# Patient Record
Sex: Female | Born: 1960 | Race: White | Hispanic: No | Marital: Single | Smoking: Current every day smoker
Health system: Southern US, Community
[De-identification: ages and names within clinical notes are randomized; demographics above are authoritative.]

## PROBLEM LIST (undated history)

## (undated) DIAGNOSIS — M1990 Unspecified osteoarthritis, unspecified site: Secondary | ICD-10-CM

## (undated) DIAGNOSIS — F419 Anxiety disorder, unspecified: Secondary | ICD-10-CM

## (undated) DIAGNOSIS — G709 Myoneural disorder, unspecified: Secondary | ICD-10-CM

## (undated) HISTORY — PX: TONSILLECTOMY: SUR1361

## (undated) HISTORY — PX: OTHER SURGICAL HISTORY: SHX169

## (undated) HISTORY — PX: JOINT REPLACEMENT: SHX530

## (undated) HISTORY — PX: BACK SURGERY: SHX140

## (undated) HISTORY — PX: PARTIAL HIP ARTHROPLASTY: SHX733

---

## 2011-05-21 DIAGNOSIS — F330 Major depressive disorder, recurrent, mild: Secondary | ICD-10-CM | POA: Diagnosis not present

## 2011-08-21 DIAGNOSIS — F330 Major depressive disorder, recurrent, mild: Secondary | ICD-10-CM | POA: Diagnosis not present

## 2011-11-20 DIAGNOSIS — F330 Major depressive disorder, recurrent, mild: Secondary | ICD-10-CM | POA: Diagnosis not present

## 2012-01-04 ENCOUNTER — Emergency Department (INDEPENDENT_AMBULATORY_CARE_PROVIDER_SITE_OTHER)
Admission: EM | Admit: 2012-01-04 | Discharge: 2012-01-04 | Disposition: A | Discharge status: Home / Self Care | Payer: Medicare Other | Source: Home / Self Care

## 2012-01-04 ENCOUNTER — Ambulatory Visit (INDEPENDENT_AMBULATORY_CARE_PROVIDER_SITE_OTHER): Payer: Medicare Other | Admitting: Sports Medicine

## 2012-01-04 ENCOUNTER — Encounter: Payer: Self-pay | Admitting: *Deleted

## 2012-01-04 DIAGNOSIS — S7010XA Contusion of unspecified thigh, initial encounter: Secondary | ICD-10-CM | POA: Diagnosis not present

## 2012-01-04 DIAGNOSIS — R29898 Other symptoms and signs involving the musculoskeletal system: Secondary | ICD-10-CM

## 2012-01-04 DIAGNOSIS — T148XXA Other injury of unspecified body region, initial encounter: Secondary | ICD-10-CM | POA: Diagnosis not present

## 2012-01-04 DIAGNOSIS — R2240 Localized swelling, mass and lump, unspecified lower limb: Secondary | ICD-10-CM

## 2012-01-04 DIAGNOSIS — M79609 Pain in unspecified limb: Secondary | ICD-10-CM

## 2012-01-04 HISTORY — DX: Anxiety disorder, unspecified: F41.9

## 2012-01-04 HISTORY — DX: Unspecified osteoarthritis, unspecified site: M19.90

## 2012-01-04 LAB — POCT CBC W AUTO DIFF (K'VILLE URGENT CARE)

## 2012-01-04 NOTE — ED Provider Notes (Signed)
History     CSN: [id number]  Arrival date & time 01/04/12  1256   First MD Initiated Contact with Patient 01/04/12 1306      Chief Complaint  Patient presents with  . Mass    rigth hip   HPI R hip mass x 1 month.  Pt states that she struck her hip on bar while moving some furniture.  Initially had mild brusing that subsided within a week. Pt states that she has had persistent R hip soft tissue swelling since this point.  Area has been fairly stable, however, area has become mildly pain over the last week.  Pt states that she has had some burning around soft tissue swelling.  No other systemic sxs including fever, chills, diffuse bruising, night sweats, weight loss.  Pt is noted to have had multiple surgeries in the past including multiple laminectomies and L hip replacement.  No recent surgeries.    Past Medical History  Diagnosis Date  . Anxiety   . Osteoarthritis     Past Surgical History  Procedure Date  . Partial hip arthroplasty     left  . Back surgery     7 Lami's, 1 fusion    Family History  Problem Relation Age of Onset  . Cancer Mother     breast  . Cancer Father     lung    History  Substance Use Topics  . Smoking status: Current Every Day Smoker -- 1.0 packs/day for 30 years    Types: Cigarettes  . Smokeless tobacco: Not on file  . Alcohol Use: No    OB History    Grav Para Term Preterm Abortions TAB SAB Ect Mult Living                  Review of Systems  All other systems reviewed and are negative.    Allergies  Augmentin  Home Medications   Current Outpatient Rx  Name  Route  Sig  Dispense  Refill  . CLONAZEPAM 1 MG PO TABS   Oral   Take 1 mg by mouth 2 (two) times daily as needed.           BP 133/90  Pulse 93  Temp 99.8 F (37.7 C) (Oral)  Resp 14  Ht 5\' 8"  (1.727 m)  Wt 199 lb (90.266 kg)  BMI 30.26 kg/m2  SpO2 97%  Physical Exam  Constitutional: She appears well-developed and well-nourished.  HENT:  Head:  Normocephalic and atraumatic.  Eyes: Conjunctivae normal are normal. Pupils are equal, round, and reactive to light.  Neck: Normal range of motion. Neck supple.  Cardiovascular: Normal rate and regular rhythm.   Pulmonary/Chest: Effort normal and breath sounds normal.  Abdominal: Soft.  Musculoskeletal:       R hip full ROM  Ambulation WNL   Neurological: She is alert.  Skin:       + soft tissue mass on R hip nonerythematous Faint peripheral bruising.      Soft tissue mass measuring approx 10x8 cm.  ED Course  Procedures (including critical care time)   Labs Reviewed  POCT CBC W AUTO DIFF ([facility])   No results found.   1. Hip region mass       MDM  Of unclear etiology.  Soft tissue cyst vs. Hematoma.  Hgb and plts WNL.  Will formally consult sports medicine as ultrasound and possible drainage may be beneficial.  Treatment plan per sports medicine.  The patient and/or caregiver has been counseled thoroughly with regard to treatment plan and/or medications prescribed including dosage, schedule, interactions, rationale for use, and possible side effects and they verbalize understanding. Diagnoses and expected course of recovery discussed and will return if not improved as expected or if the condition worsens. Patient and/or caregiver verbalized understanding.               Doree Albee, MD 01/04/12 1421

## 2012-01-04 NOTE — ED Notes (Signed)
Patient bumped her right upper lateral thigh on a bar 1 month ago. About 1 week later, 3 weeks ago, she developed a lump at the site where the bruise healed. She reports site is painful described as burning. Denies any numbness or tingling, site is not discolored or warm to touch.

## 2012-01-04 NOTE — Progress Notes (Signed)
SPORTS MEDICINE CONSULTATION REPORT  Subjective:    I'm seeing this patient as a consultation for:  Dr. Alvester Morin  CC: Right thigh swelling  HPI: Amy Thornton is a very pleasant 51 year old female who comes in after bumping her right thigh approximately one month ago. Unfortunately, she developed some bruising, but also a mass approximately 10 cm distal to the greater trochanter on the lateral thigh.  Unfortunately as his remained painful. She denies any constitutional symptoms. The pain is localized, does not radiate, it is severe.  Past medical history, Surgical history, Family history, Social history, Allergies, and medications have been entered into the medical record, reviewed, and no changes needed.   Review of Systems: No headache, visual changes, nausea, vomiting, diarrhea, constipation, dizziness, abdominal pain, skin rash, fevers, chills, night sweats, weight loss, swollen lymph nodes, body aches, joint swelling, muscle aches, chest pain, or shortness of breath.   Objective:   Vitals:  Afebrile, vital signs stable. General: Well Developed, well nourished, and in no acute distress.  Neuro/Psych: Alert and oriented x3, extra-ocular muscles intact, able to move all 4 extremities.  Skin: Warm and dry, no rashes noted.  Respiratory: Not using accessory muscles, speaking in full sentences, trachea midline.  Cardiovascular: Pulses palpable, no extremity edema. Abdomen: Does not appear distended. Right thigh: There is a fluctuant, 10 x 10 cm mass that is mildly tender to palpation, with no erythema, induration.  Procedure: Real-time Ultrasound Guided aspiration/injection of hematoma Device: GE Logiq E  Ultrasound guided injection is preferred based studies that show increased duration, increased effect, greater accuracy, decreased procedural pain, increased response rate, and decreased cost with ultrasound guided versus blind injection.  Verbal informed consent obtained.  Time-out conducted.    Noted no overlying erythema, induration, or other signs of local infection.  Skin prepped in a sterile fashion.  Local anesthesia: Topical Ethyl chloride.  With sterile technique and under real time ultrasound guidance:  5 cc lidocaine is infiltrated under the skin. 18-gauge needle a 60 cc syringe advanced under real-time ultrasound guidance into hematoma, 110 cc of grossly bloody fluid was aspirated. The syringe was switched, 1 cc Kenalog 40, 4 cc lidocaine placed into the space where the hematoma was. Area was then strapped/compressed with six-inch Ace dressing. Completed without difficulty  Pain immediately resolved suggesting accurate placement of the medication.  Advised to call if fevers/chills, erythema, induration, drainage, or persistent bleeding.  Images permanently stored and available for review in the ultrasound unit.  Impression: Technically successful ultrasound guided aspiration/injection.  Impression and Recommendations:   This case required medical decision making of moderate complexity.

## 2012-01-04 NOTE — Assessment & Plan Note (Addendum)
Status post trauma, aspirated 110 cc of bloody fluid. I injected 1 cc triamcinolone, and compressed wound. Fluid sent for culture. Dr. Alvester Morin is going to check CBC, as well as Colace. She will come back to see me in one week. If there is reaccumulation of the collection, she would likely need JP drain placement.

## 2012-01-05 LAB — SYNOVIAL CELL COUNT + DIFF, W/ CRYSTALS
Crystals, Fluid: NONE SEEN
Eosinophils-Synovial: 0 % (ref 0–1)
Lymphocytes-Synovial Fld: 35 % — ABNORMAL HIGH (ref 0–20)
Monocyte/Macrophage: 16 % — ABNORMAL LOW (ref 50–90)
Neutrophil, Synovial: 49 % — ABNORMAL HIGH (ref 0–25)
WBC, Synovial: 630 cu mm — ABNORMAL HIGH (ref 0–200)

## 2012-01-06 ENCOUNTER — Telehealth: Payer: Self-pay | Admitting: Emergency Medicine

## 2012-01-06 MED ORDER — MELOXICAM 15 MG PO TABS: 15.0000 mg | ORAL_TABLET | Freq: Every day | ORAL | Status: DC

## 2012-01-08 LAB — BODY FLUID CULTURE
Gram Stain: NONE SEEN
Organism ID, Bacteria: NO GROWTH

## 2012-01-15 ENCOUNTER — Ambulatory Visit: Payer: Medicare Other | Admitting: Sports Medicine

## 2012-01-16 ENCOUNTER — Ambulatory Visit (INDEPENDENT_AMBULATORY_CARE_PROVIDER_SITE_OTHER): Payer: Medicare Other | Admitting: Sports Medicine

## 2012-01-16 ENCOUNTER — Encounter: Payer: Self-pay | Admitting: Sports Medicine

## 2012-01-16 VITALS — BP 122/89 | HR 99 | Wt 195.0 lb

## 2012-01-16 DIAGNOSIS — T148XXA Other injury of unspecified body region, initial encounter: Secondary | ICD-10-CM

## 2012-01-16 DIAGNOSIS — S7010XA Contusion of unspecified thigh, initial encounter: Secondary | ICD-10-CM | POA: Diagnosis not present

## 2012-01-16 NOTE — Assessment & Plan Note (Signed)
No recurrence, she does have some skin dimpling. There is a smaller palpable organized hematoma. She will massage this daily, and see me back in 3 months.

## 2012-01-16 NOTE — Progress Notes (Signed)
Subjective:    CC: Followup  HPI: I aspirated 110 cc of hematoma out of this lady's right thigh at her last visit approximately 2 weeks ago. We applied a compression dressing, and she returns today essentially symptom-free. She does have a small divot in the skin on her thigh. She has no pain, no further swelling.  Preventative measures: She does desire a primary care physician in our practice, I've asked her to look to our flyer and pick one of [place].  Past medical history, Surgical history, Family history, Social history, Allergies, and medications have been entered into the medical record, reviewed, and no changes needed.   Review of Systems: No fevers, chills, night sweats, weight loss, chest pain, or shortness of breath.   Objective:    General: Well Developed, well nourished, and in no acute distress.  Neuro: Alert and oriented x3, extra-ocular muscles intact.  HEENT: Normocephalic, atraumatic, pupils equal round reactive to light, neck supple, no masses, no lymphadenopathy, thyroid nonpalpable.  Skin: Warm and dry, no rashes. Cardiac: Regular rate and rhythm, no murmurs rubs or gallops.  Respiratory: Clear to auscultation bilaterally. Not using accessory muscles, speaking in full sentences. Right Thigh:  There is a several centimeter subcutaneous nodule consistent with hematoma over the right thigh. There is also a small divot in the skin.  Impression and Recommendations:

## 2012-03-18 DIAGNOSIS — F330 Major depressive disorder, recurrent, mild: Secondary | ICD-10-CM | POA: Diagnosis not present

## 2012-04-16 ENCOUNTER — Encounter: Payer: Self-pay | Admitting: Sports Medicine

## 2012-04-16 ENCOUNTER — Ambulatory Visit (INDEPENDENT_AMBULATORY_CARE_PROVIDER_SITE_OTHER): Payer: Medicare Other | Admitting: Sports Medicine

## 2012-04-16 ENCOUNTER — Ambulatory Visit (INDEPENDENT_AMBULATORY_CARE_PROVIDER_SITE_OTHER): Payer: Medicare Other

## 2012-04-16 VITALS — BP 125/73 | HR 79 | Wt 199.0 lb

## 2012-04-16 DIAGNOSIS — T148XXA Other injury of unspecified body region, initial encounter: Secondary | ICD-10-CM

## 2012-04-16 DIAGNOSIS — Z96649 Presence of unspecified artificial hip joint: Secondary | ICD-10-CM | POA: Diagnosis not present

## 2012-04-16 DIAGNOSIS — M169 Osteoarthritis of hip, unspecified: Secondary | ICD-10-CM | POA: Diagnosis not present

## 2012-04-16 DIAGNOSIS — M1611 Unilateral primary osteoarthritis, right hip: Secondary | ICD-10-CM

## 2012-04-16 DIAGNOSIS — M1610 Unilateral primary osteoarthritis, unspecified hip: Secondary | ICD-10-CM | POA: Diagnosis not present

## 2012-04-16 DIAGNOSIS — Z96641 Presence of right artificial hip joint: Secondary | ICD-10-CM | POA: Insufficient documentation

## 2012-04-16 NOTE — Patient Instructions (Signed)
Hip Rehabilitation Protocol:  1.  Side leg raises.  3x30 with no weight, then 3x15 with 2 lb ankle weight, then 3x15 with 5 lb ankle weight 2.  Standing hip rotation.  3x30 with no weight, then 3x15 with 2 lb ankle weight, then 3x15 with 5 lb ankle weight. 3.  Side step ups.  3x30 with no weight, then 3x15 with 5 lbs in backpack, then 3x15 with 10 lbs in backpack. 

## 2012-04-16 NOTE — Assessment & Plan Note (Signed)
Seemingly resolved after aspiration. Continue to massage. I do suspect a scar tissue will resolve.

## 2012-04-16 NOTE — Progress Notes (Signed)
  Subjective:    CC: Followup  HPI: Hematoma: Occurred months ago after mild trauma. I aspirated over 100 cc of blood from her thigh, and apply compression. Since then she's been massaging of the scar tissue. She is nearly 100% improved.  Right hip pain: Status post left total hip arthroplasty, now with worsening pain that she localizes deep in the left groin and left buttock, without radiation. Worse with weightbearing. She's never had injection therapy or physical therapy with pain on the side. Pain is moderate.  Past medical history, Surgical history, Family history not pertinant except as noted below, Social history, Allergies, and medications have been entered into the medical record, reviewed, and no changes needed.   Review of Systems: No headache, visual changes, nausea, vomiting, diarrhea, constipation, dizziness, abdominal pain, skin rash, fevers, chills, night sweats, weight loss, swollen lymph nodes, body aches, joint swelling, muscle aches, chest pain, shortness of breath, mood changes, visual or auditory hallucinations.   Objective:   General: Well Developed, well nourished, and in no acute distress.  Neuro/Psych: Alert and oriented x3, extra-ocular muscles intact, able to move all 4 extremities, sensation grossly intact. Skin: Warm and dry, no rashes noted. Previous area of hematoma appears smooth and resolved. There is a small amount of nodularity. Respiratory: Not using accessory muscles, speaking in full sentences, trachea midline.  Cardiovascular: Pulses palpable, no extremity edema. Abdomen: Does not appear distended. Right Hip: ROM IR: 20 Deg and painful, ER: 45 Deg, Flexion: 120 Deg, Extension: 100 Deg, Abduction: 45 Deg, Adduction: 45 Deg Strength IR: 5/5, ER: 5/5, Flexion: 5/5, Extension: 5/5, Abduction: 5/5, Adduction: 5/5 Pelvic alignment unremarkable to inspection and palpation. Standing hip rotation and gait without trendelenburg sign / unsteadiness. Greater  trochanter without tenderness to palpation. No tenderness over piriformis and greater trochanter. No pain with FABER or FADIR. No SI joint tenderness and normal minimal SI movement.  X-ray show moderate degenerative joint disease of the right hip as well as a well-placed left total hip arthroplasty prosthesis. Impression and Recommendations:   This case required medical decision making of moderate complexity.

## 2012-04-16 NOTE — Assessment & Plan Note (Signed)
X-rays, hip abductor rehabilitation, return to see me in a couple weeks if no better I can perform intra-articular injection.

## 2012-04-30 ENCOUNTER — Ambulatory Visit: Payer: Medicare Other | Admitting: Sports Medicine

## 2012-06-24 DIAGNOSIS — F330 Major depressive disorder, recurrent, mild: Secondary | ICD-10-CM | POA: Diagnosis not present

## 2012-08-01 ENCOUNTER — Ambulatory Visit (INDEPENDENT_AMBULATORY_CARE_PROVIDER_SITE_OTHER): Payer: Medicare Other | Admitting: Sports Medicine

## 2012-08-01 ENCOUNTER — Encounter: Payer: Self-pay | Admitting: Sports Medicine

## 2012-08-01 VITALS — BP 132/76 | HR 84 | Wt 197.0 lb

## 2012-08-01 DIAGNOSIS — M169 Osteoarthritis of hip, unspecified: Secondary | ICD-10-CM

## 2012-08-01 DIAGNOSIS — M1610 Unilateral primary osteoarthritis, unspecified hip: Secondary | ICD-10-CM

## 2012-08-01 DIAGNOSIS — M1611 Unilateral primary osteoarthritis, right hip: Secondary | ICD-10-CM

## 2012-08-01 NOTE — Assessment & Plan Note (Signed)
Femoroacetabular joint injection as above. Continue home rehabilitation. Return in 4 weeks to see how things are going.

## 2012-08-01 NOTE — Progress Notes (Signed)
  Subjective:    CC: Followup  HPI: Right hip osteoarthritis: I saw this pleasant 52 year old female months ago, she has been working aggressively on hip abductor exercises, and in using oral NSAIDs. Since then she is also got married. Unfortunately she continues to have pain she localizes in the right groin, no radiation, moderate, persistent. She has had a left total hip arthroplasty in the past.  Past medical history, Surgical history, Family history not pertinant except as noted below, Social history, Allergies, and medications have been entered into the medical record, reviewed, and no changes needed.   Review of Systems: No fevers, chills, night sweats, weight loss, chest pain, or shortness of breath.   Objective:    General: Well Developed, well nourished, and in no acute distress.  Neuro: Alert and oriented x3, extra-ocular muscles intact, sensation grossly intact.  HEENT: Normocephalic, atraumatic, pupils equal round reactive to light, neck supple, no masses, no lymphadenopathy, thyroid nonpalpable.  Skin: Warm and dry, no rashes. Cardiac: Regular rate and rhythm, no murmurs rubs or gallops, no lower extremity edema.  Respiratory: Clear to auscultation bilaterally. Not using accessory muscles, speaking in full sentences.  Procedure: Real-time Ultrasound Guided Injection of right femoral acetabular joint Device: GE Logiq E  Verbal informed consent obtained.  Time-out conducted.  Noted no overlying erythema, induration, or other signs of local infection.  Skin prepped in a sterile fashion.  Local anesthesia: Topical Ethyl chloride.  With sterile technique and under real time ultrasound guidance:  Spinal needle advanced to the femoral head/neck junction, 2 cc Kenalog 40, 4 cc lidocaine injected easily. Completed without difficulty  Pain immediately resolved suggesting accurate placement of the medication.  Advised to call if fevers/chills, erythema, induration, drainage, or  persistent bleeding.  Images permanently stored and available for review in the ultrasound unit.  Impression: Technically successful ultrasound guided injection.  Impression and Recommendations:

## 2012-08-08 ENCOUNTER — Ambulatory Visit (INDEPENDENT_AMBULATORY_CARE_PROVIDER_SITE_OTHER): Payer: Medicare Other | Admitting: Sports Medicine

## 2012-08-08 ENCOUNTER — Encounter: Payer: Self-pay | Admitting: Sports Medicine

## 2012-08-08 VITALS — BP 143/78 | HR 91 | Wt 197.0 lb

## 2012-08-08 DIAGNOSIS — M1611 Unilateral primary osteoarthritis, right hip: Secondary | ICD-10-CM

## 2012-08-08 DIAGNOSIS — M1610 Unilateral primary osteoarthritis, unspecified hip: Secondary | ICD-10-CM

## 2012-08-08 DIAGNOSIS — M169 Osteoarthritis of hip, unspecified: Secondary | ICD-10-CM

## 2012-08-08 MED ORDER — TRAMADOL HCL 50 MG PO TABS: 50.0000 mg | ORAL_TABLET | Freq: Three times a day (TID) | ORAL | Status: DC | PRN

## 2012-08-08 NOTE — Progress Notes (Signed)
  Subjective:    CC: Right hip pain  HPI: Amy Thornton is a very pleasant 52 year old female with known right-sided femoral acetabular degenerative joint disease. Recently injected her femoral acetabular joint under guidance, she had complete resolution of pain, unfortunately she recently had a fall, impacting her right hip. She was able to bear weight immediately, she had a significant amount of pain she localized over the anterior joint line. It is localized, doesn't radiate, moderate to severe. She describes her pain is differently than the osteoarthritic pain she had before the injection. Pain is reproducible with hip flexion actively.  Past medical history, Surgical history, Family history not pertinant except as noted below, Social history, Allergies, and medications have been entered into the medical record, reviewed, and no changes needed.   Review of Systems: No fevers, chills, night sweats, weight loss, chest pain, or shortness of breath.   Objective:    General: Well Developed, well nourished, and in no acute distress.  Neuro: Alert and oriented x3, extra-ocular muscles intact, sensation grossly intact.  HEENT: Normocephalic, atraumatic, pupils equal round reactive to light, neck supple, no masses, no lymphadenopathy, thyroid nonpalpable.  Skin: Warm and dry, no rashes. Cardiac: Regular rate and rhythm, no murmurs rubs or gallops, no lower extremity edema.  Respiratory: Clear to auscultation bilaterally. Not using accessory muscles, speaking in full sentences. Right Hip: ROM IR: 45 Deg, ER: 45 Deg, Flexion: 120 Deg, Extension: 100 Deg, Abduction: 45 Deg, Adduction: 45 Deg Strength IR: 5/5, ER: 5/5, Flexion: 5/5, Extension: 5/5, Abduction: 5/5, Adduction: 5/5 She has no pain with passive extreme internal rotation as well as flexion of the hip, I asked her to actively flex the hip against resistance this reproduces her pain. Pelvic alignment unremarkable to inspection and palpation. Standing  hip rotation and gait without trendelenburg sign / unsteadiness. Greater trochanter without tenderness to palpation. No tenderness over piriformis and greater trochanter. No pain with FABER or FADIR. No SI joint tenderness and normal minimal SI movement.  Impression and Recommendations:

## 2012-08-08 NOTE — Assessment & Plan Note (Signed)
Femoral acetabular pain continues to be gone. Her recent injury has caused the hip flexor strain, I do not think she done any permanent damage, and I do not think he needs another injection. Tramadol, hip flexor rehabilitation. I would like to see her back in approximately 2-3 weeks to ensure things continue to go well.

## 2012-08-26 ENCOUNTER — Encounter: Payer: Self-pay | Admitting: Sports Medicine

## 2012-08-26 ENCOUNTER — Ambulatory Visit (INDEPENDENT_AMBULATORY_CARE_PROVIDER_SITE_OTHER): Payer: Medicare Other | Admitting: Sports Medicine

## 2012-08-26 VITALS — BP 127/80 | HR 84 | Wt 196.0 lb

## 2012-08-26 DIAGNOSIS — M1610 Unilateral primary osteoarthritis, unspecified hip: Secondary | ICD-10-CM

## 2012-08-26 DIAGNOSIS — M169 Osteoarthritis of hip, unspecified: Secondary | ICD-10-CM

## 2012-08-26 DIAGNOSIS — M1611 Unilateral primary osteoarthritis, right hip: Secondary | ICD-10-CM

## 2012-08-26 NOTE — Progress Notes (Signed)
  Subjective:    CC: Followup  HPI: Right hip osteoarthritis:  Amy Thornton has had a single intra-articular injection in the distant past into her femoroacetabular joint.  She did extremely well, but unfortunately had a fall several weeks ago. I diagnosed her initially with a hip flexor strain, but unfortunately her pain has continued. She desires intra-articular interventional treatment. Pain is localized in the groin, no radiation, worse with ambulation.  Past medical history, Surgical history, Family history not pertinant except as noted below, Social history, Allergies, and medications have been entered into the medical record, reviewed, and no changes needed.   Review of Systems: No fevers, chills, night sweats, weight loss, chest pain, or shortness of breath.   Objective:    General: Well Developed, well nourished, and in no acute distress.  Neuro: Alert and oriented x3, extra-ocular muscles intact, sensation grossly intact.  HEENT: Normocephalic, atraumatic, pupils equal round reactive to light, neck supple, no masses, no lymphadenopathy, thyroid nonpalpable.  Skin: Warm and dry, no rashes. Cardiac: Regular rate and rhythm, no murmurs rubs or gallops, no lower extremity edema.  Respiratory: Clear to auscultation bilaterally. Not using accessory muscles, speaking in full sentences.  Procedure: Real-time Ultrasound Guided Injection of right femoroacetabular joint Device: GE Logiq E  Verbal informed consent obtained.  Time-out conducted.  Noted no overlying erythema, induration, or other signs of local infection.  Skin prepped in a sterile fashion.  Local anesthesia: Topical Ethyl chloride.  With sterile technique and under real time ultrasound guidance:  Spinal needle advanced to the femoral head/neck junction, 2 cc Kenalog 40, or cc lidocaine injected easily into the joint. Completed without difficulty  Pain immediately resolved suggesting accurate placement of the medication.  Advised to  call if fevers/chills, erythema, induration, drainage, or persistent bleeding.  Images permanently stored and available for review in the ultrasound unit.  Impression: Technically successful ultrasound guided injection.  Impression and Recommendations:

## 2012-08-26 NOTE — Assessment & Plan Note (Signed)
Persistent pain after a fall. Hip flexor rehabilitation did not resolve this, she did have excellent response to a right-sided femoral acetabular injection several months ago. Femoral acetabular injection repeated today. Return in 4 weeks to see how things are going.

## 2012-08-29 ENCOUNTER — Ambulatory Visit: Payer: Medicare Other | Admitting: Sports Medicine

## 2012-09-04 ENCOUNTER — Telehealth: Payer: Self-pay

## 2012-09-04 DIAGNOSIS — M1611 Unilateral primary osteoarthritis, right hip: Secondary | ICD-10-CM

## 2012-09-04 NOTE — Telephone Encounter (Signed)
Pt informed as directed. Thursa Emme,CMA \

## 2012-09-04 NOTE — Telephone Encounter (Signed)
Double tramadol to 2 tabs 3 times a day. If she is continuing to have pain we need to consider surgical referral for consideration of hip replacement.

## 2012-09-04 NOTE — Telephone Encounter (Signed)
Patient called stated that she is in pain wants to know if you can call her something in for pain until her next visit she states that the tramadol is not working.

## 2012-09-08 ENCOUNTER — Other Ambulatory Visit: Payer: Self-pay

## 2012-09-08 DIAGNOSIS — M1611 Unilateral primary osteoarthritis, right hip: Secondary | ICD-10-CM

## 2012-09-08 MED ORDER — TRAMADOL HCL 50 MG PO TABS: 100.0000 mg | ORAL_TABLET | Freq: Three times a day (TID) | ORAL | Status: DC | PRN

## 2012-09-23 ENCOUNTER — Ambulatory Visit: Payer: Medicare Other | Admitting: Sports Medicine

## 2012-10-02 DIAGNOSIS — F330 Major depressive disorder, recurrent, mild: Secondary | ICD-10-CM | POA: Diagnosis not present

## 2012-10-07 ENCOUNTER — Ambulatory Visit (INDEPENDENT_AMBULATORY_CARE_PROVIDER_SITE_OTHER): Payer: Medicare Other | Admitting: Sports Medicine

## 2012-10-07 ENCOUNTER — Encounter: Payer: Self-pay | Admitting: Sports Medicine

## 2012-10-07 VITALS — BP 138/88 | HR 94 | Wt 196.0 lb

## 2012-10-07 DIAGNOSIS — M1610 Unilateral primary osteoarthritis, unspecified hip: Secondary | ICD-10-CM

## 2012-10-07 DIAGNOSIS — M5416 Radiculopathy, lumbar region: Secondary | ICD-10-CM | POA: Insufficient documentation

## 2012-10-07 DIAGNOSIS — M169 Osteoarthritis of hip, unspecified: Secondary | ICD-10-CM

## 2012-10-07 DIAGNOSIS — IMO0002 Reserved for concepts with insufficient information to code with codable children: Secondary | ICD-10-CM | POA: Diagnosis not present

## 2012-10-07 DIAGNOSIS — M1611 Unilateral primary osteoarthritis, right hip: Secondary | ICD-10-CM

## 2012-10-07 MED ORDER — HYDROCODONE-ACETAMINOPHEN 10-325 MG PO TABS: 1.0000 | ORAL_TABLET | Freq: Three times a day (TID) | ORAL | Status: DC | PRN

## 2012-10-07 MED ORDER — PREDNISONE 50 MG PO TABS: ORAL_TABLET | ORAL | Status: DC

## 2012-10-07 MED ORDER — CYCLOBENZAPRINE HCL 10 MG PO TABS: ORAL_TABLET | ORAL | Status: DC

## 2012-10-07 MED ORDER — KETOROLAC TROMETHAMINE 30 MG/ML IJ SOLN
30.0000 mg | Freq: Once | INTRAMUSCULAR | Status: AC
  Administered 2012-10-07: 30 mg via INTRAMUSCULAR

## 2012-10-07 NOTE — Assessment & Plan Note (Addendum)
Status post lumbar fusion in the distant past, I am unsure as to what level. Toradol 30 mg intramuscular. She is having some right-sided radicular symptoms, likely L4 versus L5. Prednisone, Flexeril, Norco, x-rays, return to see me in one month to see how things are going. Home exercises.

## 2012-10-07 NOTE — Assessment & Plan Note (Signed)
Status post 2 intra-articular injections with guidance. Her most recent injection has not provided pain relief we would like. At this point I'm going to have her see Dr. Turner Daniels for consideration of right total hip arthroplasty, she is already status post left total hip arthroplasty.

## 2012-10-07 NOTE — Progress Notes (Signed)
  Subjective:    CC: Followup  HPI: Right hip pain: He is status post a left total hip arthroplasty, I have been injecting her right hip joint. The first injection provided a tremendous response, the most recent one provided only a day of improvement. Pain is localized in the right groin, worse with weightbearing, moderate, persistent.  Right lumbar radiculitis: She is status post lumbar fusion, she is unsure what level. She has pain that she localizes over the anterior right thigh, as well as the anterolateral right lower leg with numbness in the dorsum of the foot. Pain is worse with Valsalva, and worse with sitting.  Past medical history, Surgical history, Family history not pertinant except as noted below, Social history, Allergies, and medications have been entered into the medical record, reviewed, and no changes needed.   Review of Systems: No fevers, chills, night sweats, weight loss, chest pain, or shortness of breath.   Objective:    General: Well Developed, well nourished, and in no acute distress.  Neuro: Alert and oriented x3, extra-ocular muscles intact, sensation grossly intact.  HEENT: Normocephalic, atraumatic, pupils equal round reactive to light, neck supple, no masses, no lymphadenopathy, thyroid nonpalpable.  Skin: Warm and dry, no rashes. Cardiac: Regular rate and rhythm, no murmurs rubs or gallops, no lower extremity edema.  Respiratory: Clear to auscultation bilaterally. Not using accessory muscles, speaking in full sentences. Back Exam:  Inspection: Unremarkable  Motion: Flexion 45 deg, Extension 45 deg, Side Bending to 45 deg bilaterally,  Rotation to 45 deg bilaterally  SLR laying: Negative  XSLR laying: Negative  Palpable tenderness: None. FABER: negative. Sensory change: Gross sensation intact to all lumbar and sacral dermatomes.  Reflexes: 2+ at both patellar tendons, 2+ at achilles tendons, Babinski's downgoing.  Strength at foot  Plantar-flexion: 5/5  Dorsi-flexion: 5/5 Eversion: 5/5 Inversion: 5/5  Leg strength  Quad: 5/5 Hamstring: 5/5 Hip flexor: 5/5 Hip abductors: 5/5  Gait unremarkable. Exquisite pain with internal rotation of the right hip.  Previous x-rays have shown moderate osteoarthritis.  Impression and Recommendations:

## 2012-10-14 DIAGNOSIS — IMO0002 Reserved for concepts with insufficient information to code with codable children: Secondary | ICD-10-CM | POA: Diagnosis not present

## 2012-10-14 DIAGNOSIS — M169 Osteoarthritis of hip, unspecified: Secondary | ICD-10-CM | POA: Diagnosis not present

## 2012-10-23 ENCOUNTER — Other Ambulatory Visit: Payer: Self-pay | Admitting: Orthopedic Surgery

## 2012-10-24 ENCOUNTER — Encounter (HOSPITAL_COMMUNITY): Payer: Self-pay | Admitting: Pharmacy Technician

## 2012-10-29 ENCOUNTER — Encounter (HOSPITAL_COMMUNITY): Payer: Self-pay

## 2012-10-29 ENCOUNTER — Encounter (HOSPITAL_COMMUNITY)
Admission: RE | Admit: 2012-10-29 | Discharge: 2012-10-29 | Disposition: A | Discharge status: Home / Self Care | Payer: Medicare Other | Source: Ambulatory Visit | Attending: Orthopedic Surgery | Admitting: Orthopedic Surgery

## 2012-10-29 DIAGNOSIS — Z01818 Encounter for other preprocedural examination: Secondary | ICD-10-CM | POA: Diagnosis not present

## 2012-10-29 DIAGNOSIS — Z01812 Encounter for preprocedural laboratory examination: Secondary | ICD-10-CM | POA: Insufficient documentation

## 2012-10-29 DIAGNOSIS — Z01810 Encounter for preprocedural cardiovascular examination: Secondary | ICD-10-CM | POA: Diagnosis not present

## 2012-10-29 DIAGNOSIS — I10 Essential (primary) hypertension: Secondary | ICD-10-CM | POA: Diagnosis not present

## 2012-10-29 HISTORY — DX: Myoneural disorder, unspecified: G70.9

## 2012-10-29 LAB — BASIC METABOLIC PANEL
BUN: 10 mg/dL (ref 6–23)
CO2: 30 mEq/L (ref 19–32)
Calcium: 9.5 mg/dL (ref 8.4–10.5)
Chloride: 97 mEq/L (ref 96–112)
Creatinine, Ser: 0.46 mg/dL — ABNORMAL LOW (ref 0.50–1.10)
GFR calc Af Amer: >90 mL/min (ref >90)
GFR calc non Af Amer: >90 mL/min (ref >90)
Glucose, Bld: 88 mg/dL (ref 70–99)
Potassium: 4.4 mEq/L (ref 3.5–5.1)
Sodium: 137 mEq/L (ref 135–145)

## 2012-10-29 LAB — TYPE AND SCREEN
ABO/RH(D): O POS
Antibody Screen: NEGATIVE

## 2012-10-29 LAB — URINALYSIS, ROUTINE W REFLEX MICROSCOPIC
Bilirubin Urine: NEGATIVE
Glucose, UA: NEGATIVE mg/dL
Hgb urine dipstick: NEGATIVE
Ketones, ur: NEGATIVE mg/dL
Leukocytes, UA: NEGATIVE
Nitrite: NEGATIVE
Protein, ur: NEGATIVE mg/dL
Specific Gravity, Urine: 1.002 — ABNORMAL LOW (ref 1.005–1.030)
Urobilinogen, UA: 0.2 mg/dL (ref 0.0–1.0)
pH: 6 (ref 5.0–8.0)

## 2012-10-29 LAB — CBC WITH DIFFERENTIAL/PLATELET
Basophils Absolute: 0 10*3/uL (ref 0.0–0.1)
Basophils Relative: 0 % (ref 0–1)
Eosinophils Absolute: 0.1 10*3/uL (ref 0.0–0.7)
Eosinophils Relative: 1 % (ref 0–5)
HCT: 46.2 % — ABNORMAL HIGH (ref 36.0–46.0)
Hemoglobin: 16.3 g/dL — ABNORMAL HIGH (ref 12.0–15.0)
Lymphocytes Relative: 31 % (ref 12–46)
Lymphs Abs: 2.1 10*3/uL (ref 0.7–4.0)
MCH: 34.5 pg — ABNORMAL HIGH (ref 26.0–34.0)
MCHC: 35.3 g/dL (ref 30.0–36.0)
MCV: 97.7 fL (ref 78.0–100.0)
Monocytes Absolute: 0.6 10*3/uL (ref 0.1–1.0)
Monocytes Relative: 8 % (ref 3–12)
Neutro Abs: 4.1 10*3/uL (ref 1.7–7.7)
Neutrophils Relative %: 60 % (ref 43–77)
Platelets: 205 10*3/uL (ref 150–400)
RBC: 4.73 MIL/uL (ref 3.87–5.11)
RDW: 13 % (ref 11.5–15.5)
WBC: 6.9 10*3/uL (ref 4.0–10.5)

## 2012-10-29 LAB — PROTIME-INR
INR: 1 (ref 0.00–1.49)
Prothrombin Time: 13 seconds (ref 11.6–15.2)

## 2012-10-29 LAB — APTT: aPTT: 28 seconds (ref 24–37)

## 2012-10-29 LAB — ABO/RH: ABO/RH(D): O POS

## 2012-10-29 LAB — SURGICAL PCR SCREEN
MRSA, PCR: NEGATIVE
Staphylococcus aureus: NEGATIVE

## 2012-10-29 NOTE — Pre-Procedure Instructions (Signed)
Emmajean Ratledge  10/29/2012   Your procedure is scheduled on:  11/03/12  Report to [facility] [phone].  Call this number if you have problems the morning of surgery: [phone]   Remember:   Do not eat food or drink liquids after midnight.   Take these medicines the morning of surgery with A SIP OF WATER: pain med, clonazepam   Do not wear jewelry, make-up or nail polish.  Do not wear lotions, powders, or perfumes. You may wear deodorant.  Do not shave 48 hours prior to surgery. Men may shave face and neck.  Do not bring valuables to the hospital.  [facility] is not responsible                   for any belongings or valuables.  Contacts, dentures or bridgework may not be worn into surgery.  Leave suitcase in the car. After surgery it may be brought to your room.  For patients admitted to the hospital, checkout time is 11:00 AM the day of  discharge.   Patients discharged the day of surgery will not be allowed to drive  home.  Name and phone number of your driver:   Special Instructions: Incentive Spirometry - Practice and bring it with you on the day of surgery. Shower using CHG 2 nights before surgery and the night before surgery.  If you shower the day of surgery use CHG.  Use special wash - you have one bottle of CHG for all showers.  You should use approximately 1/3 of the bottle for each shower.   Please read over the following fact sheets that you were given: Pain Booklet, Coughing and Deep Breathing, Blood Transfusion Information, MRSA Information and Surgical Site Infection Prevention

## 2012-10-30 ENCOUNTER — Encounter: Payer: Self-pay | Admitting: *Deleted

## 2012-10-30 ENCOUNTER — Emergency Department (INDEPENDENT_AMBULATORY_CARE_PROVIDER_SITE_OTHER)
Admission: EM | Admit: 2012-10-30 | Discharge: 2012-10-30 | Disposition: A | Discharge status: Home / Self Care | Payer: Medicare Other | Source: Home / Self Care | Attending: Family Medicine | Admitting: Family Medicine

## 2012-10-30 DIAGNOSIS — L02419 Cutaneous abscess of limb, unspecified: Secondary | ICD-10-CM | POA: Diagnosis not present

## 2012-10-30 DIAGNOSIS — L03115 Cellulitis of right lower limb: Secondary | ICD-10-CM

## 2012-10-30 MED ORDER — MUPIROCIN 2 % EX OINT: TOPICAL_OINTMENT | Freq: Three times a day (TID) | CUTANEOUS | Status: DC

## 2012-10-30 MED ORDER — DOXYCYCLINE HYCLATE 100 MG PO CAPS: 100.0000 mg | ORAL_CAPSULE | Freq: Two times a day (BID) | ORAL | Status: DC

## 2012-10-30 NOTE — ED Notes (Signed)
Amy Thornton reports awaking with a possible insect bite to her right lateral ankle 1 week ago. Since, it has become more red, itching and swollen. Denies fever or chills. She is having surgery on her hip in 1 week.

## 2012-10-30 NOTE — ED Provider Notes (Signed)
CSN: [id number]     Arrival date & time 10/30/12  1558 History   First MD Initiated Contact with Patient 10/30/12 1631     Chief Complaint  Patient presents with  . Insect Bite    RLE     HPI Comments: Patient noticed a small bump on her right lower leg 9 days ago that she thought was an insect bite.  The area has gradually increased in size, becoming red but not tender.  No drainage from lesion.  No fevers, chills, and sweats.  She is to undergo right hip replacement in 4 days.  Patient is a 52 y.o. female presenting with abscess. The history is provided by the patient.  Abscess Location:  Leg Leg abscess location:  R lower leg Size:  2.5cm Abscess quality: induration, itching, redness and warmth   Abscess quality: not draining, no fluctuance, not painful and not weeping   Red streaking: no   Duration:  9 days Progression:  Worsening Chronicity:  New Context: insect bite/sting   Relieved by:  Nothing Worsened by:  Nothing tried Ineffective treatments:  None tried Associated symptoms: no fatigue, no fever and no nausea     Past Medical History  Diagnosis Date  . Anxiety   . Osteoarthritis   . Neuromuscular disorder     pinched nerve leg   Past Surgical History  Procedure Laterality Date  . Partial hip arthroplasty      left x2  . Back surgery      7 Lami's, 1 fusion  . Cesarean section    . Tonsillectomy    . Joint replacement     Family History  Problem Relation Age of Onset  . Cancer Mother     breast  . Cancer Father     lung   History  Substance Use Topics  . Smoking status: Current Every Day Smoker -- 1.00 packs/day for 30 years    Types: Cigarettes  . Smokeless tobacco: Never Used  . Alcohol Use: 6.0 oz/week    10 Cans of beer per week   OB History   Grav Para Term Preterm Abortions TAB SAB Ect Mult Living                 Review of Systems  Constitutional: Negative for fever and fatigue.  Gastrointestinal: Negative for nausea.    Allergies    Augmentin and Morphine and related  Home Medications   Current Outpatient Rx  Name  Route  Sig  Dispense  Refill  . clonazePAM (KLONOPIN) 1 MG tablet   Oral   Take 1 mg by mouth 3 (three) times daily.          Marland Kitchen doxycycline (VIBRAMYCIN) 100 MG capsule   Oral   Take 1 capsule (100 mg total) by mouth 2 (two) times daily.   20 capsule   0   . HYDROcodone-acetaminophen (NORCO) 10-325 MG per tablet   Oral   Take 1 tablet by mouth every 8 (eight) hours as needed for pain.   40 tablet   0   . mupirocin ointment (BACTROBAN) 2 %   Topical   Apply topically 3 (three) times daily.   22 g   0    BP 130/84  Pulse 103  Temp(Src) 98.3 F (36.8 C) (Oral)  Resp 14  Ht 5\' 8"  (1.727 m)  Wt 192 lb (87.091 kg)  BMI 29.2 kg/m2  SpO2 99% Physical Exam  Nursing note and vitals reviewed. Constitutional: She is  oriented to person, place, and time. She appears well-developed and well-nourished. No distress.  HENT:  Head: Normocephalic.  Eyes: Conjunctivae are normal. Pupils are equal, round, and reactive to light.  Cardiovascular: Normal heart sounds.   Pulmonary/Chest: Breath sounds normal.  Musculoskeletal: She exhibits no edema.       Right lower leg: She exhibits no tenderness, no bony tenderness, no swelling, no edema and no deformity.       Legs: Right lower leg has an erythematous macular area 2.5cm dia as noted on diagram.  Area is slightly indurated but not fluctuant and no drainage is present.  Neurological: She is alert and oriented to person, place, and time.  Skin: Skin is warm and dry.    ED Course  Procedures          MDM   1. Cellulitis of right lower leg     Begin doxycycline, and topical Bactroban. Apply heating pad 3 to 4 times daily. Return for worsening symptoms or follow-up PCP    Lattie Haw, MD 10/30/12 [phone]

## 2012-10-31 ENCOUNTER — Ambulatory Visit: Payer: Medicare Other | Admitting: Sports Medicine

## 2012-10-31 NOTE — H&P (Signed)
TOTAL HIP ADMISSION H&P  Patient is admitted for right total hip arthroplasty.  Subjective:  Chief Complaint: right hip pain  HPI: Amy Thornton, 52 y.o. female, has a history of pain and functional disability in the right hip(s) due to arthritis and patient has failed non-surgical conservative treatments for greater than 12 weeks to include NSAID's and/or analgesics.  Onset of symptoms was gradual starting 1 years ago with rapidlly worsening course since that time.The patient noted no past surgery on the right hip(s).  Patient currently rates pain in the right hip at 10 out of 10 with activity. Patient has night pain, worsening of pain with activity and weight bearing, pain that interfers with activities of daily living and pain with passive range of motion. Patient has evidence of joint space narrowing by imaging studies. This condition presents safety issues increasing the risk of falls.  There is no current active infection.  Patient Active Problem List   Diagnosis Date Noted  . Right lumbar radiculitis 10/07/2012  . Osteoarthritis of right hip 04/16/2012   Past Medical History  Diagnosis Date  . Anxiety   . Osteoarthritis   . Neuromuscular disorder     pinched nerve leg    Past Surgical History  Procedure Laterality Date  . Partial hip arthroplasty      left x2  . Back surgery      7 Lami's, 1 fusion  . Cesarean section    . Tonsillectomy    . Joint replacement      No prescriptions prior to admission   Allergies  Allergen Reactions  . Augmentin [Amoxicillin-Pot Clavulanate] Nausea And Vomiting  . Morphine And Related Hives and Itching    History  Substance Use Topics  . Smoking status: Current Every Day Smoker -- 1.00 packs/day for 30 years    Types: Cigarettes  . Smokeless tobacco: Never Used  . Alcohol Use: 6.0 oz/week    10 Cans of beer per week    Family History  Problem Relation Age of Onset  . Cancer Mother     breast  . Cancer Father     lung     Review  of Systems  Constitutional: Negative.   HENT: Negative.   Eyes: Negative.   Respiratory: Negative.   Cardiovascular: Negative.   Gastrointestinal: Negative.   Genitourinary: Negative.   Musculoskeletal: Positive for joint pain.  Neurological: Negative.   Endo/Heme/Allergies: Negative.   Psychiatric/Behavioral: Negative.     Objective:  Physical Exam  Constitutional: She is oriented to person, place, and time. She appears well-developed and well-nourished.  HENT:  Head: Normocephalic and atraumatic.  Eyes: Pupils are equal, round, and reactive to light.  Neck: Normal range of motion. Neck supple.  Cardiovascular: Intact distal pulses.   Respiratory: Effort normal and breath sounds normal.  Musculoskeletal: She exhibits tenderness.  Neurological: She is alert and oriented to person, place, and time. She has normal reflexes.  Skin: Skin is warm and dry.  Psychiatric: She has a normal mood and affect. Her behavior is normal. Judgment and thought content normal.    Vital signs in last 24 hours:    Labs:   Estimated body mass index is 29.81 kg/(m^2) as calculated from the following:   Height as of 01/04/12: 5\' 8"  (1.727 m).   Weight as of 10/07/12: 88.905 kg (196 lb).   Imaging Review Plain radiographs demonstrate moderate degenerative joint disease of the right hip(s). The bone quality appears to be good for age and reported activity  level.  Assessment/Plan:  End stage arthritis, right hip(s)  The patient history, physical examination, clinical judgement of the provider and imaging studies are consistent with end stage degenerative joint disease of the right hip(s) and total hip arthroplasty is deemed medically necessary. The treatment options including medical management, injection therapy, arthroscopy and arthroplasty were discussed at length. The risks and benefits of total hip arthroplasty were presented and reviewed. The risks due to aseptic loosening, infection,  stiffness, dislocation/subluxation,  thromboembolic complications and other imponderables were discussed.  The patient acknowledged the explanation, agreed to proceed with the plan and consent was signed. Patient is being admitted for inpatient treatment for surgery, pain control, PT, OT, prophylactic antibiotics, VTE prophylaxis, progressive ambulation and ADL's and discharge planning.The patient is planning to be discharged home with home health services

## 2012-11-02 MED ORDER — CLINDAMYCIN PHOSPHATE 900 MG/50ML IV SOLN
900.0000 mg | INTRAVENOUS | Status: AC
  Administered 2012-11-03: 900 mg via INTRAVENOUS
  Filled 2012-11-02: qty 50

## 2012-11-03 ENCOUNTER — Inpatient Hospital Stay (HOSPITAL_COMMUNITY): Payer: Medicare Other | Admitting: Anesthesiology

## 2012-11-03 ENCOUNTER — Encounter (HOSPITAL_COMMUNITY): Payer: Self-pay | Admitting: Anesthesiology

## 2012-11-03 ENCOUNTER — Inpatient Hospital Stay (HOSPITAL_COMMUNITY): Payer: Medicare Other

## 2012-11-03 ENCOUNTER — Encounter (HOSPITAL_COMMUNITY)
Admission: RE | Disposition: A | Discharge status: Home Health | Payer: Self-pay | Source: Ambulatory Visit | Attending: Orthopedic Surgery

## 2012-11-03 ENCOUNTER — Inpatient Hospital Stay (HOSPITAL_COMMUNITY)
Admission: RE | Admit: 2012-11-03 | Discharge: 2012-11-05 | DRG: 470 | Disposition: A | Discharge status: Home Health | Payer: Medicare Other | Source: Ambulatory Visit | Attending: Orthopedic Surgery | Admitting: Orthopedic Surgery

## 2012-11-03 DIAGNOSIS — Z96649 Presence of unspecified artificial hip joint: Secondary | ICD-10-CM

## 2012-11-03 DIAGNOSIS — Z471 Aftercare following joint replacement surgery: Secondary | ICD-10-CM | POA: Diagnosis not present

## 2012-11-03 DIAGNOSIS — F411 Generalized anxiety disorder: Secondary | ICD-10-CM | POA: Diagnosis present

## 2012-11-03 DIAGNOSIS — M1610 Unilateral primary osteoarthritis, unspecified hip: Principal | ICD-10-CM | POA: Diagnosis present

## 2012-11-03 DIAGNOSIS — G8918 Other acute postprocedural pain: Secondary | ICD-10-CM | POA: Diagnosis not present

## 2012-11-03 DIAGNOSIS — M1611 Unilateral primary osteoarthritis, right hip: Secondary | ICD-10-CM

## 2012-11-03 DIAGNOSIS — M169 Osteoarthritis of hip, unspecified: Secondary | ICD-10-CM | POA: Diagnosis not present

## 2012-11-03 DIAGNOSIS — M5416 Radiculopathy, lumbar region: Secondary | ICD-10-CM

## 2012-11-03 DIAGNOSIS — G709 Myoneural disorder, unspecified: Secondary | ICD-10-CM | POA: Diagnosis present

## 2012-11-03 DIAGNOSIS — M25559 Pain in unspecified hip: Secondary | ICD-10-CM | POA: Diagnosis not present

## 2012-11-03 DIAGNOSIS — Z96641 Presence of right artificial hip joint: Secondary | ICD-10-CM | POA: Diagnosis present

## 2012-11-03 DIAGNOSIS — F17200 Nicotine dependence, unspecified, uncomplicated: Secondary | ICD-10-CM | POA: Diagnosis present

## 2012-11-03 HISTORY — PX: TOTAL HIP ARTHROPLASTY: SHX124

## 2012-11-03 SURGERY — ARTHROPLASTY, HIP, TOTAL,POSTERIOR APPROACH
Anesthesia: General | Site: Hip | Laterality: Right | Wound class: Clean

## 2012-11-03 MED ORDER — ROCURONIUM BROMIDE 100 MG/10ML IV SOLN
INTRAVENOUS | Status: DC | PRN
  Administered 2012-11-03: 50 mg via INTRAVENOUS
  Administered 2012-11-03: 20 mg via INTRAVENOUS

## 2012-11-03 MED ORDER — ONDANSETRON HCL 4 MG/2ML IJ SOLN: 4.0000 mg | Freq: Four times a day (QID) | INTRAMUSCULAR | Status: DC | PRN

## 2012-11-03 MED ORDER — TRANEXAMIC ACID 100 MG/ML IV SOLN
1000.0000 mg | INTRAVENOUS | Status: AC
  Administered 2012-11-03: 1000 mg via INTRAVENOUS
  Filled 2012-11-03: qty 10

## 2012-11-03 MED ORDER — BUPIVACAINE-EPINEPHRINE PF 0.5-1:200000 % IJ SOLN
INTRAMUSCULAR | Status: DC | PRN
  Administered 2012-11-03: 25 mL

## 2012-11-03 MED ORDER — OXYCODONE HCL 5 MG PO TABS
ORAL_TABLET | ORAL | Status: AC
  Administered 2012-11-03: 22:00:00
  Filled 2012-11-03: qty 1

## 2012-11-03 MED ORDER — OXYCODONE HCL 5 MG/5ML PO SOLN: 5.0000 mg | Freq: Once | ORAL | Status: AC | PRN

## 2012-11-03 MED ORDER — MIDAZOLAM HCL 2 MG/2ML IJ SOLN
INTRAMUSCULAR | Status: AC
  Administered 2012-11-03: 2 mg
  Filled 2012-11-03: qty 2

## 2012-11-03 MED ORDER — OXYCODONE HCL 5 MG PO TABS
5.0000 mg | ORAL_TABLET | Freq: Once | ORAL | Status: AC | PRN
  Administered 2012-11-03: 5 mg via ORAL

## 2012-11-03 MED ORDER — ACETAMINOPHEN 650 MG RE SUPP: 650.0000 mg | Freq: Four times a day (QID) | RECTAL | Status: DC | PRN

## 2012-11-03 MED ORDER — OXYCODONE HCL 5 MG PO TABS
5.0000 mg | ORAL_TABLET | ORAL | Status: DC | PRN
  Administered 2012-11-03: 5 mg via ORAL
  Administered 2012-11-04 – 2012-11-05 (×7): 10 mg via ORAL
  Filled 2012-11-03: qty 1
  Filled 2012-11-03 (×7): qty 2

## 2012-11-03 MED ORDER — MENTHOL 3 MG MT LOZG: 1.0000 | LOZENGE | OROMUCOSAL | Status: DC | PRN

## 2012-11-03 MED ORDER — ONDANSETRON HCL 4 MG PO TABS: 4.0000 mg | ORAL_TABLET | Freq: Four times a day (QID) | ORAL | Status: DC | PRN

## 2012-11-03 MED ORDER — METOCLOPRAMIDE HCL 5 MG/ML IJ SOLN
5.0000 mg | Freq: Three times a day (TID) | INTRAMUSCULAR | Status: DC | PRN
  Filled 2012-11-03: qty 2

## 2012-11-03 MED ORDER — MIDAZOLAM HCL 5 MG/5ML IJ SOLN
INTRAMUSCULAR | Status: DC | PRN
  Administered 2012-11-03: 2 mg via INTRAVENOUS

## 2012-11-03 MED ORDER — ACETAMINOPHEN 325 MG PO TABS: 650.0000 mg | ORAL_TABLET | Freq: Four times a day (QID) | ORAL | Status: DC | PRN

## 2012-11-03 MED ORDER — METHOCARBAMOL 100 MG/ML IJ SOLN
500.0000 mg | Freq: Four times a day (QID) | INTRAVENOUS | Status: DC | PRN
  Filled 2012-11-03: qty 5

## 2012-11-03 MED ORDER — FENTANYL CITRATE 0.05 MG/ML IJ SOLN
INTRAMUSCULAR | Status: AC
  Administered 2012-11-03: 2 ug
  Filled 2012-11-03: qty 2

## 2012-11-03 MED ORDER — ASPIRIN EC 325 MG PO TBEC: 325.0000 mg | DELAYED_RELEASE_TABLET | Freq: Two times a day (BID) | ORAL | Status: DC

## 2012-11-03 MED ORDER — BUPIVACAINE-EPINEPHRINE PF 0.25-1:200000 % IJ SOLN
INTRAMUSCULAR | Status: DC | PRN
  Administered 2012-11-03: 20 mL

## 2012-11-03 MED ORDER — METHOCARBAMOL 500 MG PO TABS: 500.0000 mg | ORAL_TABLET | Freq: Two times a day (BID) | ORAL | Status: DC

## 2012-11-03 MED ORDER — DEXMEDETOMIDINE HCL IN NACL 200 MCG/50ML IV SOLN
0.4000 ug/kg/h | INTRAVENOUS | Status: DC
  Administered 2012-11-03: 0.3 ug/kg/h via INTRAVENOUS
  Filled 2012-11-03 (×2): qty 50

## 2012-11-03 MED ORDER — PHENOL 1.4 % MT LIQD: 1.0000 | OROMUCOSAL | Status: DC | PRN

## 2012-11-03 MED ORDER — SENNOSIDES-DOCUSATE SODIUM 8.6-50 MG PO TABS: 1.0000 | ORAL_TABLET | Freq: Every evening | ORAL | Status: DC | PRN

## 2012-11-03 MED ORDER — HYDROCODONE-ACETAMINOPHEN 10-325 MG PO TABS: 1.0000 | ORAL_TABLET | Freq: Four times a day (QID) | ORAL | Status: DC | PRN

## 2012-11-03 MED ORDER — ASPIRIN EC 325 MG PO TBEC
325.0000 mg | DELAYED_RELEASE_TABLET | Freq: Every day | ORAL | Status: DC
  Administered 2012-11-04 – 2012-11-05 (×2): 325 mg via ORAL
  Filled 2012-11-03 (×4): qty 1

## 2012-11-03 MED ORDER — DOXYCYCLINE HYCLATE 100 MG PO TABS
100.0000 mg | ORAL_TABLET | Freq: Two times a day (BID) | ORAL | Status: DC
  Administered 2012-11-04 – 2012-11-05 (×3): 100 mg via ORAL
  Filled 2012-11-03 (×5): qty 1

## 2012-11-03 MED ORDER — MUPIROCIN 2 % EX OINT
TOPICAL_OINTMENT | Freq: Three times a day (TID) | CUTANEOUS | Status: DC
  Administered 2012-11-03: 22:00:00 via TOPICAL
  Administered 2012-11-04: 1 via TOPICAL
  Administered 2012-11-04: 22:00:00 via TOPICAL
  Administered 2012-11-04 – 2012-11-05 (×2): 1 via TOPICAL
  Filled 2012-11-03: qty 22

## 2012-11-03 MED ORDER — PROPOFOL 10 MG/ML IV BOLUS
INTRAVENOUS | Status: DC | PRN
  Administered 2012-11-03 (×2): 100 mg via INTRAVENOUS
  Administered 2012-11-03: 200 mg via INTRAVENOUS

## 2012-11-03 MED ORDER — DOXYCYCLINE HYCLATE 100 MG PO CAPS: 100.0000 mg | ORAL_CAPSULE | Freq: Two times a day (BID) | ORAL | Status: DC

## 2012-11-03 MED ORDER — DOCUSATE SODIUM 100 MG PO CAPS
100.0000 mg | ORAL_CAPSULE | Freq: Two times a day (BID) | ORAL | Status: DC
  Administered 2012-11-03 – 2012-11-05 (×4): 100 mg via ORAL
  Filled 2012-11-03 (×4): qty 1

## 2012-11-03 MED ORDER — ONDANSETRON HCL 4 MG/2ML IJ SOLN
INTRAMUSCULAR | Status: DC | PRN
  Administered 2012-11-03: 4 mg via INTRAVENOUS

## 2012-11-03 MED ORDER — KCL IN DEXTROSE-NACL 20-5-0.45 MEQ/L-%-% IV SOLN
INTRAVENOUS | Status: DC
  Administered 2012-11-03: 125 mL/h via INTRAVENOUS
  Administered 2012-11-04: 22:00:00 via INTRAVENOUS
  Administered 2012-11-04: 125 mL/h via INTRAVENOUS
  Administered 2012-11-04: 14:00:00 via INTRAVENOUS
  Filled 2012-11-03 (×7): qty 1000

## 2012-11-03 MED ORDER — METOCLOPRAMIDE HCL 5 MG PO TABS
5.0000 mg | ORAL_TABLET | Freq: Three times a day (TID) | ORAL | Status: DC | PRN
  Filled 2012-11-03: qty 2

## 2012-11-03 MED ORDER — NEOSTIGMINE METHYLSULFATE 1 MG/ML IJ SOLN
INTRAMUSCULAR | Status: DC | PRN
  Administered 2012-11-03: 5 mg via INTRAVENOUS

## 2012-11-03 MED ORDER — MAGNESIUM CITRATE PO SOLN
1.0000 | Freq: Once | ORAL | Status: AC | PRN
  Filled 2012-11-03: qty 296

## 2012-11-03 MED ORDER — ONDANSETRON HCL 4 MG/2ML IJ SOLN: 4.0000 mg | Freq: Once | INTRAMUSCULAR | Status: DC | PRN

## 2012-11-03 MED ORDER — METHOCARBAMOL 500 MG PO TABS
500.0000 mg | ORAL_TABLET | Freq: Four times a day (QID) | ORAL | Status: DC | PRN
  Administered 2012-11-03 – 2012-11-05 (×4): 500 mg via ORAL
  Filled 2012-11-03 (×4): qty 1

## 2012-11-03 MED ORDER — CHLORHEXIDINE GLUCONATE 4 % EX LIQD: 60.0000 mL | Freq: Once | CUTANEOUS | Status: DC

## 2012-11-03 MED ORDER — HYDROMORPHONE HCL PF 1 MG/ML IJ SOLN
0.2500 mg | INTRAMUSCULAR | Status: DC | PRN
  Administered 2012-11-03 (×4): 0.5 mg via INTRAVENOUS

## 2012-11-03 MED ORDER — LACTATED RINGERS IV SOLN
INTRAVENOUS | Status: DC
  Administered 2012-11-03: 11:00:00 via INTRAVENOUS

## 2012-11-03 MED ORDER — FENTANYL CITRATE 0.05 MG/ML IJ SOLN
INTRAMUSCULAR | Status: DC | PRN
  Administered 2012-11-03 (×2): 50 ug via INTRAVENOUS
  Administered 2012-11-03: 100 ug via INTRAVENOUS
  Administered 2012-11-03: 50 ug via INTRAVENOUS
  Administered 2012-11-03: 100 ug via INTRAVENOUS
  Administered 2012-11-03 (×4): 50 ug via INTRAVENOUS
  Administered 2012-11-03 (×2): 100 ug via INTRAVENOUS

## 2012-11-03 MED ORDER — BISACODYL 5 MG PO TBEC: 5.0000 mg | DELAYED_RELEASE_TABLET | Freq: Every day | ORAL | Status: DC | PRN

## 2012-11-03 MED ORDER — HYDROMORPHONE HCL PF 1 MG/ML IJ SOLN
0.5000 mg | INTRAMUSCULAR | Status: DC | PRN
Start: 2012-11-03 — End: 2012-11-04
  Administered 2012-11-03: 22:00:00 via INTRAVENOUS
  Administered 2012-11-04: 0.5 mg via INTRAVENOUS
  Filled 2012-11-03 (×3): qty 1

## 2012-11-03 MED ORDER — CLONAZEPAM 1 MG PO TABS
1.0000 mg | ORAL_TABLET | Freq: Three times a day (TID) | ORAL | Status: DC
  Administered 2012-11-03 – 2012-11-05 (×5): 1 mg via ORAL
  Filled 2012-11-03 (×5): qty 1

## 2012-11-03 MED ORDER — DIPHENHYDRAMINE HCL 12.5 MG/5ML PO ELIX: 12.5000 mg | ORAL_SOLUTION | ORAL | Status: DC | PRN

## 2012-11-03 MED ORDER — DEXTROSE-NACL 5-0.45 % IV SOLN: INTRAVENOUS | Status: DC

## 2012-11-03 MED ORDER — LACTATED RINGERS IV SOLN
INTRAVENOUS | Status: DC | PRN
  Administered 2012-11-03 (×2): via INTRAVENOUS

## 2012-11-03 MED ORDER — GLYCOPYRROLATE 0.2 MG/ML IJ SOLN
INTRAMUSCULAR | Status: DC | PRN
  Administered 2012-11-03: .8 mg via INTRAVENOUS

## 2012-11-03 MED ORDER — BUPIVACAINE-EPINEPHRINE PF 0.25-1:200000 % IJ SOLN
INTRAMUSCULAR | Status: AC
  Filled 2012-11-03: qty 30

## 2012-11-03 MED ORDER — SODIUM CHLORIDE 0.9 % IR SOLN
Status: DC | PRN
  Administered 2012-11-03: 1000 mL

## 2012-11-03 MED ORDER — DEXAMETHASONE SODIUM PHOSPHATE 4 MG/ML IJ SOLN
INTRAMUSCULAR | Status: DC | PRN
  Administered 2012-11-03: 4 mg

## 2012-11-03 MED ORDER — LIDOCAINE HCL (CARDIAC) 20 MG/ML IV SOLN
INTRAVENOUS | Status: DC | PRN
  Administered 2012-11-03: 100 mg via INTRAVENOUS

## 2012-11-03 MED ORDER — HYDROMORPHONE HCL PF 1 MG/ML IJ SOLN
INTRAMUSCULAR | Status: AC
  Filled 2012-11-03: qty 1

## 2012-11-03 SURGICAL SUPPLY — 52 items
BLADE SAW SGTL 18X1.27X75 (BLADE) ×2 IMPLANT
BRUSH FEMORAL CANAL (MISCELLANEOUS) IMPLANT
CAPT HIP PF COP ×2 IMPLANT
CLOTH BEACON ORANGE TIMEOUT ST (SAFETY) ×2 IMPLANT
COVER BACK TABLE 24X17X13 BIG (DRAPES) IMPLANT
COVER SURGICAL LIGHT HANDLE (MISCELLANEOUS) ×2 IMPLANT
DRAPE ORTHO SPLIT 77X108 STRL (DRAPES) ×1
DRAPE PROXIMA HALF (DRAPES) ×2 IMPLANT
DRAPE SURG ORHT 6 SPLT 77X108 (DRAPES) ×1 IMPLANT
DRAPE U-SHAPE 47X51 STRL (DRAPES) ×2 IMPLANT
DRILL BIT 7/64X5 (BIT) ×2 IMPLANT
DRSG MEPILEX BORDER 4X12 (GAUZE/BANDAGES/DRESSINGS) ×2 IMPLANT
DRSG MEPILEX BORDER 4X8 (GAUZE/BANDAGES/DRESSINGS) ×2 IMPLANT
DURAPREP 26ML APPLICATOR (WOUND CARE) ×2 IMPLANT
ELECT BLADE 4.0 EZ CLEAN MEGAD (MISCELLANEOUS) ×2
ELECT REM PT RETURN 9FT ADLT (ELECTROSURGICAL) ×2
ELECTRODE BLDE 4.0 EZ CLN MEGD (MISCELLANEOUS) ×1 IMPLANT
ELECTRODE REM PT RTRN 9FT ADLT (ELECTROSURGICAL) ×1 IMPLANT
GAUZE XEROFORM 1X8 LF (GAUZE/BANDAGES/DRESSINGS) IMPLANT
GLOVE BIO SURGEON STRL SZ7.5 (GLOVE) ×2 IMPLANT
GLOVE BIO SURGEON STRL SZ8.5 (GLOVE) ×2 IMPLANT
GLOVE BIOGEL PI IND STRL 8 (GLOVE) IMPLANT
GLOVE BIOGEL PI IND STRL 9 (GLOVE) ×1 IMPLANT
GLOVE BIOGEL PI INDICATOR 8 (GLOVE)
GLOVE BIOGEL PI INDICATOR 9 (GLOVE) ×1
GOWN PREVENTION PLUS XLARGE (GOWN DISPOSABLE) ×2 IMPLANT
GOWN STRL NON-REIN LRG LVL3 (GOWN DISPOSABLE) ×4 IMPLANT
GOWN STRL REIN XL XLG (GOWN DISPOSABLE) ×4 IMPLANT
HANDPIECE INTERPULSE COAX TIP (DISPOSABLE)
HOOD PEEL AWAY FACE SHEILD DIS (HOOD) ×4 IMPLANT
KIT BASIN OR (CUSTOM PROCEDURE TRAY) ×2 IMPLANT
KIT ROOM TURNOVER OR (KITS) ×2 IMPLANT
MANIFOLD NEPTUNE II (INSTRUMENTS) ×2 IMPLANT
NEEDLE 22X1 1/2 (OR ONLY) (NEEDLE) ×2 IMPLANT
NS IRRIG 1000ML POUR BTL (IV SOLUTION) ×2 IMPLANT
PACK TOTAL JOINT (CUSTOM PROCEDURE TRAY) ×2 IMPLANT
PAD ARMBOARD 7.5X6 YLW CONV (MISCELLANEOUS) ×4 IMPLANT
PASSER SUT SWANSON 36MM LOOP (INSTRUMENTS) ×2 IMPLANT
PRESSURIZER FEMORAL UNIV (MISCELLANEOUS) IMPLANT
SET HNDPC FAN SPRY TIP SCT (DISPOSABLE) IMPLANT
SUT ETHIBOND 2 V 37 (SUTURE) ×2 IMPLANT
SUT ETHILON 3 0 FSL (SUTURE) ×2 IMPLANT
SUT VIC AB 0 CTB1 27 (SUTURE) ×2 IMPLANT
SUT VIC AB 1 CTX 36 (SUTURE) ×1
SUT VIC AB 1 CTX36XBRD ANBCTR (SUTURE) ×1 IMPLANT
SUT VIC AB 2-0 CTB1 (SUTURE) ×2 IMPLANT
SYR CONTROL 10ML LL (SYRINGE) ×2 IMPLANT
TOWEL OR 17X24 6PK STRL BLUE (TOWEL DISPOSABLE) ×2 IMPLANT
TOWEL OR 17X26 10 PK STRL BLUE (TOWEL DISPOSABLE) ×2 IMPLANT
TOWER CARTRIDGE SMART MIX (DISPOSABLE) IMPLANT
TRAY FOLEY CATH 14FR (SET/KITS/TRAYS/PACK) ×2 IMPLANT
WATER STERILE IRR 1000ML POUR (IV SOLUTION) ×4 IMPLANT

## 2012-11-03 NOTE — Anesthesia Postprocedure Evaluation (Signed)
  Anesthesia Post-op Note  Patient: Amy Thornton  Procedure(s) Performed: Procedure(s): TOTAL HIP ARTHROPLASTY- right (Right)  Patient Location: PACU  Anesthesia Type:General  Level of Consciousness: awake, alert  and oriented  Airway and Oxygen Therapy: Patient Spontanous Breathing and Patient connected to nasal cannula oxygen  Post-op Pain: mild  Post-op Assessment: Post-op Vital signs reviewed, Patient's Cardiovascular Status Stable, Respiratory Function Stable, Patent Airway and Pain level controlled  Post-op Vital Signs: stable  Complications: No apparent anesthesia complications

## 2012-11-03 NOTE — Op Note (Signed)
OPERATIVE REPORT    DATE OF PROCEDURE:  11/03/2012       PREOPERATIVE DIAGNOSIS:  END STAGE ARTHRITIS RIGHT HIP                                                          POSTOPERATIVE DIAGNOSIS:  END STAGE ARTHRITIS RIGHT HIP                                                           PROCEDURE:  R total hip arthroplasty using a 52 mm DePuy Pinnacle  Cup, [employer], 10-degree polyethylene liner index superior  and posterior, a +3 36 mm ceramic head, a [phone] SROM stem, 20Fsm Sleeve   SURGEON: Hartlyn Reigel J    ASSISTANT:   Eric K. Gaylene Brooks  (present throughout entire procedure and necessary for timely completion of the procedure)   ANESTHESIA: General BLOOD LOSS: 300 FLUID REPLACEMENT: 1500 crystalloid DRAINS: Foley Catheter URINE OUTPUT: 300cc COMPLICATIONS: none    INDICATIONS FOR PROCEDURE: A 52 y.o. year-old With  END STAGE ARTHRITIS RIGHT HIP   for 2 years, x-rays show bone-on-bone arthritic changes. Despite conservative measures with observation, anti-inflammatory medicine, narcotics, use of a cane, has severe unremitting pain and can ambulate only a few blocks before resting.  Patient desires elective R total hip arthroplasty to decrease pain and increase function. The risks, benefits, and alternatives were discussed at length including but not limited to the risks of infection, bleeding, nerve injury, stiffness, blood clots, the need for revision surgery, cardiopulmonary complications, among others, and they were willing to proceed. Questions answered     PROCEDURE IN DETAIL: The patient was identified by armband,  received preoperative IV antibiotics in the holding area at [facility], taken to the operating room , appropriate anesthetic monitors  were attached and general endotracheal anesthesia induced. Foley catheter was inserted. Pt was rolled into the L lateral decubitus position and fixed there with a Stulberg Mark II pelvic clamp.  The R lower  extremity was then prepped and draped  in the usual sterile fashion from the ankle to the hemipelvis. A time-out  procedure was performed. The skin along the lateral hip and thigh  infiltrated with 10 mL of 0.5% Marcaine and epinephrine solution. We  then made a posterolateral approach to the hip. With a #10 blade, a 18 cm  incision was made through the skin and subcutaneous tissue down to the level of the  IT band. Small bleeders were identified and cauterized. The IT band was cut in  line with skin incision exposing the greater trochanter. A Cobra retractor was placed between the gluteus minimus and the superior hip joint capsule, and a spiked Cobra between the quadratus femoris and the inferior hip joint capsule. This isolated the short  external rotators and piriformis tendons. These were tagged with a #2 Ethibond  suture and cut off their insertion on the intertrochanteric crest. The posterior  capsule was then developed into an acetabular-based flap from Posterior Superior off of the acetabulum out over the femoral neck and back posterior inferior to the acetabular rim. This flap  was tagged with two #2 Ethibond sutures and retracted protecting the sciatic nerve. This exposed the arthritic femoral head and osteophytes. The hip was then flexed and internally rotated, dislocating the femoral head and a standard neck cut performed 1 fingerbreadth above the lesser trochanter.  A spiked Cobra was placed in the cotyloid notch and a Hohmann retractor was then used to lever the femur anteriorly off of the anterior pelvic column. A posterior-inferior wing retractor was placed at the junction of the acetabulum and the ischium completing the acetabular exposure.We then removed the peripheral osteophytes and labrum from the acetabulum. We then reamed the acetabulum up to 51 mm with basket reamers obtaining good coverage in all quadrants. We then irrigated with normal  saline solution and hammered into place a  52 mm pinnacle cup in 45  degrees of abduction and about 20 degrees of anteversion. More  peripheral osteophytes removed and a trial 10-degree liner placed with the  index superior-posterior. The hip was then flexed and internally rotated exposing the  proximal femur, which was entered with the initiating reamer followed by  the axial reamers up to a 15.5 mm full depth and 15mm partial depth. We then conically reamed to 14F to the correct depth for a 42 base neck. The calcar was milled to 14Fsm. A trial cone and stem was inserted in the 25 degrees anteversion, with a +0 36mm trial head. Trial reduction was then performed and excellent stability was noted with at 90 of flexion with 75 of internal rotation and then full extension with maximal external rotation. The hip could not be dislocated in full extension. The knee could easily flex  to about 130 degrees. We also stretched the abductors at this point,  because of the preexisting adductor contractures. All trial components  were then removed. The acetabulum was irrigated out with normal saline  solution. A titanium [facility] was then screwed into place  followed by a 10-degree polyethylene liner index superior-posterior. On  the femoral side a 14Fsm ZTT1 sleeve was hammered into place, followed by a [phone] SROM stem in 25 degrees of anteversion. At this point, a +0 36 mm ceramic head was  hammered on the stem. The hip was reduced. We checked our stability  one more time and found it to be excellent. The wound was once again  thoroughly irrigated out with normal saline solution pulse lavage. The  capsular flap and short external rotators were repaired back to the  intertrochanteric crest through drill holes with a #2 Ethibond suture.  The IT band was closed with running 1 Vicryl suture. The subcutaneous  tissue with 0 and 2-0 undyed Vicryl suture and the skin with running  interlocking 3-0 nylon suture. Dressing of Xeroform and  Mepilex was  then applied. The patient was then unclamped, rolled supine, awaken extubated and taken to recovery room without difficulty in stable condition.   [name] 11/03/2012, 2:08 PM

## 2012-11-03 NOTE — Preoperative (Signed)
Beta Blockers   Reason not to administer Beta Blockers:Not Applicable 

## 2012-11-03 NOTE — Progress Notes (Signed)
Orthopedic Tech Progress Note Patient Details:  Amy Thornton 1960/05/04 [id number]  Patient ID: [name], female   DOB: [birth year], 52 y.o.   MRN: [id number] Trapeze bar patient helper  Nikki Dom 11/03/2012, 10:01 PM

## 2012-11-03 NOTE — Progress Notes (Signed)
Orthopedic Tech Progress Note Patient Details:  [name] December 24, 1960 [id number]  Patient ID: [name], female   DOB: 05-24-1960, 52 y.o.   MRN: [id number] Viewed order from doctor's order list  Nikki Dom 11/03/2012, 10:01 PM

## 2012-11-03 NOTE — Transfer of Care (Signed)
Immediate Anesthesia Transfer of Care Note  Patient: Amy Thornton  Procedure(s) Performed: Procedure(s): TOTAL HIP ARTHROPLASTY- right (Right)  Patient Location: PACU  Anesthesia Type:General  Level of Consciousness: awake, alert , oriented and patient cooperative  Airway & Oxygen Therapy: Patient Spontanous Breathing and Patient connected to nasal cannula oxygen  Post-op Assessment: Report given to PACU RN and Post -op Vital signs reviewed and stable  Post vital signs: Reviewed and stable  Complications: No apparent anesthesia complications

## 2012-11-03 NOTE — Anesthesia Procedure Notes (Signed)
Anesthesia Regional Block:  Femoral nerve block  Pre-Anesthetic Checklist: ,, timeout performed, Correct Patient, Correct Site, Correct Laterality, Correct Procedure, Correct Position, site marked, Risks and benefits discussed,  Surgical consent,  Pre-op evaluation,  At surgeon's request and post-op pain management  Laterality: Right and Lower  Prep: chloraprep       Needles:  Injection technique: Single-shot  Needle Type: Echogenic Needle     Needle Length: 9cm  Needle Gauge: 21 and 21 G    Additional Needles:  Procedures: ultrasound guided (picture in chart) Femoral nerve block Narrative:  Start time: 11/03/2012 12:37 PM End time: 11/03/2012 12:18 PM Injection made incrementally with aspirations every 5 mL.  Performed by: Personally  Anesthesiologist: Sheldon Silvan, MD  Additional Notes: I performed a Fascia Iliaca block in place of the above listed Femoral block.  Femoral nerve block

## 2012-11-03 NOTE — Anesthesia Preprocedure Evaluation (Signed)
Anesthesia Evaluation  Patient identified by MRN, date of birth, ID band Patient awake    Reviewed: Allergy & Precautions, H&P , NPO status , Patient's Chart, lab work & pertinent test results  Airway Mallampati: I TM Distance: >3 FB Neck ROM: Full    Dental  (+) Dental Advisory Given and Missing   Pulmonary Current Smoker,  breath sounds clear to auscultation        Cardiovascular Rhythm:Regular Rate:Normal     Neuro/Psych    GI/Hepatic   Endo/Other    Renal/GU      Musculoskeletal   Abdominal   Peds  Hematology   Anesthesia Other Findings   Reproductive/Obstetrics                           Anesthesia Physical Anesthesia Plan  ASA: II  Anesthesia Plan: General   Post-op Pain Management:    Induction: Intravenous  Airway Management Planned: Oral ETT  Additional Equipment:   Intra-op Plan:   Post-operative Plan: Extubation in OR  Informed Consent: I have reviewed the patients History and Physical, chart, labs and discussed the procedure including the risks, benefits and alternatives for the proposed anesthesia with the patient or authorized representative who has indicated his/her understanding and acceptance.   Dental advisory given  Plan Discussed with: CRNA, Anesthesiologist and Surgeon  Anesthesia Plan Comments:         Anesthesia Quick Evaluation

## 2012-11-03 NOTE — Interval H&P Note (Signed)
History and Physical Interval Note:  11/03/2012 11:22 AM  Amy Thornton  has presented today for surgery, with the diagnosis of END STAGE ARTHRITIS RIGHT HIP  The various methods of treatment have been discussed with the patient and family. After consideration of risks, benefits and other options for treatment, the patient has consented to  Procedure(s): TOTAL HIP ARTHROPLASTY (Right) as a surgical intervention .  The patient's history has been reviewed, patient examined, no change in status, stable for surgery.  I have reviewed the patient's chart and labs.  Questions were answered to the patient's satisfaction.     [name]

## 2012-11-03 NOTE — Interval H&P Note (Signed)
History and Physical Interval Note:  11/03/2012 11:31 AM  Amy Thornton  has presented today for surgery, with the diagnosis of END STAGE ARTHRITIS RIGHT HIP  The various methods of treatment have been discussed with the patient and family. After consideration of risks, benefits and other options for treatment, the patient has consented to  Procedure(s): TOTAL HIP ARTHROPLASTY (Right) as a surgical intervention .  The patient's history has been reviewed, patient examined, no change in status, stable for surgery.  I have reviewed the patient's chart and labs.  Questions were answered to the patient's satisfaction.     [name]

## 2012-11-04 ENCOUNTER — Encounter (HOSPITAL_COMMUNITY): Payer: Self-pay | Admitting: *Deleted

## 2012-11-04 ENCOUNTER — Ambulatory Visit: Payer: Medicare Other | Admitting: Sports Medicine

## 2012-11-04 LAB — BASIC METABOLIC PANEL
BUN: 4 mg/dL — ABNORMAL LOW (ref 6–23)
CO2: 27 mEq/L (ref 19–32)
Calcium: 8.2 mg/dL — ABNORMAL LOW (ref 8.4–10.5)
Chloride: 105 mEq/L (ref 96–112)
Creatinine, Ser: 0.35 mg/dL — ABNORMAL LOW (ref 0.50–1.10)
GFR calc Af Amer: >90 mL/min (ref >90)
GFR calc non Af Amer: >90 mL/min (ref >90)
Glucose, Bld: 121 mg/dL — ABNORMAL HIGH (ref 70–99)
Potassium: 3.7 mEq/L (ref 3.5–5.1)
Sodium: 139 mEq/L (ref 135–145)

## 2012-11-04 LAB — CBC
HCT: 35.8 % — ABNORMAL LOW (ref 36.0–46.0)
Hemoglobin: 12.2 g/dL (ref 12.0–15.0)
MCH: 33.4 pg (ref 26.0–34.0)
MCHC: 34.1 g/dL (ref 30.0–36.0)
MCV: 98.1 fL (ref 78.0–100.0)
Platelets: 153 10*3/uL (ref 150–400)
RBC: 3.65 MIL/uL — ABNORMAL LOW (ref 3.87–5.11)
RDW: 12.8 % (ref 11.5–15.5)
WBC: 7.2 10*3/uL (ref 4.0–10.5)

## 2012-11-04 MED ORDER — NICOTINE 7 MG/24HR TD PT24
7.0000 mg | MEDICATED_PATCH | Freq: Every day | TRANSDERMAL | Status: DC
  Administered 2012-11-04: 7 mg via TRANSDERMAL
  Filled 2012-11-04 (×2): qty 1

## 2012-11-04 MED ORDER — HYDROMORPHONE HCL PF 1 MG/ML IJ SOLN
1.0000 mg | INTRAMUSCULAR | Status: DC | PRN
  Administered 2012-11-04 – 2012-11-05 (×4): 1 mg via INTRAVENOUS
  Filled 2012-11-04 (×4): qty 1

## 2012-11-04 NOTE — Progress Notes (Signed)
Pt requested that foley remain until PT is able to evaluate do to pain groin area. Ilean Skill  LPN

## 2012-11-04 NOTE — Progress Notes (Addendum)
PATIENT ID: Amy Thornton  MRN: [id number]  DOB/AGE:  04-15-1960 / 52 y.o.  1 Day Post-Op Procedure(s) (LRB): TOTAL HIP ARTHROPLASTY- right (Right)    PROGRESS NOTE Subjective: Patient is alert, oriented,no Nausea, no Vomiting, yes passing gas, no Bowel Movement. Taking PO well. Denies SOB, Chest or Calf Pain. Using Incentive Spirometer, PAS in place. Ambulate weight bearing as tolerated Patient reports pain as 8 on 0-10 scale, patient points to the injection site for her nerve block as the central location of her pain, groin and lateral hip pain are actually quite low. Patient is a smoker and would like to try a nicotine patch.Patient also reports decreased pain in the lateral aspect of the right leg where she had cellulitis, that she is on doxycycline for preoperatively. .    Objective: Vital signs in last 24 hours: Filed Vitals:   11/03/12 1945 11/03/12 1956 11/04/12 0241 11/04/12 0434  BP:  119/78 111/71 115/42  Pulse: 72 75 91 87  Temp:  98.2 F (36.8 C) 97.8 F (36.6 C) 99.3 F (37.4 C)  TempSrc:  Oral Oral Oral  Resp: 12 14 18    Weight:      SpO2: 100% 100% 100% 100%      Intake/Output from previous day: I/O last 3 completed shifts: In: 3883.8 [P.O.:1040; I.V.:2843.8] Out: 4800 [Urine:4450; Blood:350]   Intake/Output this shift:     LABORATORY DATA:  Recent Labs  11/04/12 0415  WBC 7.2  HGB 12.2  HCT 35.8*  PLT 153  NA 139  K 3.7  CL 105  CO2 27  BUN 4*  CREATININE 0.35*  GLUCOSE 121*  CALCIUM 8.2*    Examination: Neurologically intact ABD soft Neurovascular intact Sensation intact distally Intact pulses distally Dorsiflexion/Plantar flexion intact Incision: no drainage No cellulitis present Compartment soft} XR AP&Lat of hip shows well placed\fixed THA,Erythematous lesion of the distal aspect of the right leg is nontender, 2 cm in size and diminishing.  Assessment:   1 Day Post-Op Procedure(s) (LRB): TOTAL HIP ARTHROPLASTY- right  (Right) ADDITIONAL DIAGNOSIS: Tobacco abuse,2 x 3 cm erythematous lesion to the distal aspect of the right leg.  Patient was placed on doxycycline preoperatively as there was concern this may have cellulitis from a tic or spider bite.   Plan: PT/OT WBAT, THA  posterior precautions  DVT Prophylaxis: SCDx72 hrs, ASA 325 mg BID x 2 weeks, nicotine patch has been prescribed today, Continue doxycycline as prescribed by her primary care provider for 7 more days.  DISCHARGE PLAN: Home  DISCHARGE NEEDS: HHPT, HHRN, CPM, Walker and 3-in-1 comode seat\

## 2012-11-04 NOTE — Plan of Care (Signed)
Spoke with the nurse of Dr Turner Daniels and message left to see if MD can increase iv pain meds. Pt also requests that foley be d/c tomorrow instead of today. Waiting for MD to call back.

## 2012-11-04 NOTE — Progress Notes (Signed)
UR COMPLETED  

## 2012-11-04 NOTE — Clinical Social Work Note (Signed)
CSW received a consult for possible SNF placement. PT recommendation for pt is home health PT. CSW signing off on pt. Please re-consult if pt discharge disposition changes.  Darlyn Chamber, MSW, LCSWA Clinical Social Work [phone]

## 2012-11-04 NOTE — Progress Notes (Signed)
PT Cancellation Note  Patient Details Name: Maydelin Deming MRN: [id number] DOB: 1960-06-23   Cancelled Treatment:    Reason Eval/Treat Not Completed: Other (comment) (pt reports she is too painful.  ).  "Oh, no, I am not getting out of the bed today.  I am in way to much pain."  Pt would not consider going through her leg exercises either.  PT to check back in PM as time allows to attempt exercises or mobility.  History taken.     Rollene Rotunda Colletta Spillers, PT, DPT [phone]   11/04/2012, 9:20 AM

## 2012-11-04 NOTE — Evaluation (Signed)
Physical Therapy Evaluation Patient Details Name: Amy Thornton MRN: [id number] DOB: [birth year] Today's Date: 11/04/2012 Time: 4696-2952 (pt also preformed part of hx 0902-0921) PT Time Calculation (min): 11 min  PT Assessment / Plan / Recommendation History of Present Illness  52 y.o. female admitted to [facility] for elective R THA (posterior), WBAT.  Per pt report she has also had over a dozen spine surgeries leaving her right foot numb and 2 hip surgeries on her left side.    Clinical Impression  Pt refused AM session and is very reluctant to even do exercises in PM session today.  Per RN she moves well in bed, but will have no convincing to get up today.  Based on her history and experience with her left leg surgeries I anticipate when she does decide to get up that she will move well.      PT Assessment  Patient needs continued PT services    Follow Up Recommendations  Home health PT;Supervision for mobility/OOB    Does the patient have the potential to tolerate intense rehabilitation     Yes  Barriers to Discharge Other (comment) (None) None    Equipment Recommendations  None recommended by PT    Recommendations for Other Services   None  Frequency 7X/week    Precautions / Restrictions Precautions Precautions: Posterior Hip Precaution Comments: reviewed (verbally) posterior hip precautions and WB status Restrictions Weight Bearing Restrictions: Yes RLE Weight Bearing: Weight bearing as tolerated   Pertinent Vitals/Pain See vitals flow sheet.       Mobility  Bed Mobility Bed Mobility: Not assessed (pt refused) Transfers Transfers: Not assessed (pt refused) Ambulation/Gait Ambulation/Gait Assistance: Not tested (comment) (pt refused)    Exercises Total Joint Exercises Ankle Circles/Pumps: AROM;Both;10 reps;Supine Quad Sets: AROM;Both;10 reps;Supine Gluteal Sets: AROM;Both;10 reps;Supine Heel Slides: AAROM;Right;10 reps;Supine   PT Diagnosis: Difficulty  walking;Abnormality of gait;Generalized weakness;Acute pain  PT Problem List: Decreased strength;Decreased range of motion;Decreased activity tolerance;Decreased balance;Decreased mobility;Decreased knowledge of use of DME;Decreased knowledge of precautions;Impaired sensation;Pain PT Treatment Interventions: DME instruction;Gait training;Stair training;Functional mobility training;Therapeutic activities;Therapeutic exercise;Balance training;Patient/family education;Modalities     PT Goals(Current goals can be found in the care plan section) Acute Rehab PT Goals Patient Stated Goal: to decrease right groin pain PT Goal Formulation: With patient Time For Goal Achievement: 11/11/12 Potential to Achieve Goals: Good  Visit Information  Last PT Received On: 11/04/12 Assistance Needed: +1 History of Present Illness: 52 y.o. female admitted to [facility] for elective R THA (posterior), WBAT.  Per pt report she has also had over a dozen spine surgeries leaving her right foot numb and 2 hip surgeries on her left side.         Prior Functioning  Home Living Family/patient expects to be discharged to:: Private residence Living Arrangements: Spouse/significant other Available Help at Discharge: Friend(s);Available 24 hours/day Type of Home: House Home Access: Level entry Home Layout: One level Home Equipment: Walker - 2 wheels;Bedside commode;Cane - single point;Grab bars - tub/shower;Hand held shower head;Wheelchair - manual Prior Function Level of Independence: Independent with assistive device(s) Comments: used cane still driving, not working Musician: No difficulties Dominant Hand: Left    Cognition  Cognition Arousal/Alertness: Awake/alert Behavior During Therapy: Anxious Overall Cognitive Status: Within Functional Limits for tasks assessed    Extremity/Trunk Assessment Upper Extremity Assessment Upper Extremity Assessment: Defer to OT evaluation Lower Extremity  Assessment Lower Extremity Assessment: RLE deficits/detail RLE Deficits / Details: right leg ankle 3/5, knee 3-/5, hip 2+/5.   RLE Sensation:  decreased light touch;history of peripheral neuropathy Cervical / Trunk Assessment Cervical / Trunk Assessment: Other exceptions Cervical / Trunk Exceptions: h/o of "10-15" low back surgeries which left her with right leg numbness      End of Session PT - End of Session Activity Tolerance: Patient limited by pain Patient left: in bed;with call bell/phone within reach    Whitney B. Jennalee Greaves, PT, DPT [phone]   11/04/2012, 1:59 PM

## 2012-11-05 LAB — CBC
HCT: 38.3 % (ref 36.0–46.0)
Hemoglobin: 12.4 g/dL (ref 12.0–15.0)
MCH: 32.6 pg (ref 26.0–34.0)
MCHC: 32.4 g/dL (ref 30.0–36.0)
MCV: 100.8 fL — ABNORMAL HIGH (ref 78.0–100.0)
Platelets: 147 10*3/uL — ABNORMAL LOW (ref 150–400)
RBC: 3.8 MIL/uL — ABNORMAL LOW (ref 3.87–5.11)
RDW: 13 % (ref 11.5–15.5)
WBC: 7.5 10*3/uL (ref 4.0–10.5)

## 2012-11-05 NOTE — Progress Notes (Signed)
PATIENT ID: Amy Thornton  MRN: [id number]  DOB/AGE:  10/13/1960 / 52 y.o.  2 Days Post-Op Procedure(s) (LRB): TOTAL HIP ARTHROPLASTY- right (Right)    PROGRESS NOTE Subjective: Patient is alert, oriented,no Nausea, no Vomiting, yes passing gas, no Bowel Movement. Taking PO well, pt finished breakfast. Denies SOB, Chest or Calf Pain. Using Incentive Spirometer, PAS in place. Ambulate WBAT Patient reports pain as mild  .    Objective: Vital signs in last 24 hours: Filed Vitals:   11/05/12 0000 11/05/12 0129 11/05/12 0400 11/05/12 0557  BP:  138/65  131/48  Pulse:  101  101  Temp:  99.6 F (37.6 C)  98.9 F (37.2 C)  TempSrc:  Oral  Oral  Resp: 20 18 20 18   Height:      Weight:      SpO2: 93% 95% 95% 99%      Intake/Output from previous day: I/O last 3 completed shifts: In: 2583.8 [P.O.:1440; I.V.:1143.8] Out: 8300 [Urine:8300]   Intake/Output this shift:     LABORATORY DATA:  Recent Labs  11/04/12 0415  WBC 7.2  HGB 12.2  HCT 35.8*  PLT 153  NA 139  K 3.7  CL 105  CO2 27  BUN 4*  CREATININE 0.35*  GLUCOSE 121*  CALCIUM 8.2*    Examination: Neurologically intact Neurovascular intact Sensation intact distally Intact pulses distally Dorsiflexion/Plantar flexion intact Incision: dressing C/D/I No cellulitis present Compartment soft} XR AP&Lat of hip shows well placed\fixed THA  Assessment:   2 Days Post-Op Procedure(s) (LRB): TOTAL HIP ARTHROPLASTY- right (Right) ADDITIONAL DIAGNOSIS:  Tobacco abuse, 2 by 3 cm lesion on distal right leg. Pt on oral doxy preop.  Plan: PT/OT WBAT, THA  posterior precautions  DVT Prophylaxis: SCDx72 hrs, ASA 325 mg BID x 2 weeks  DISCHARGE PLAN: Home when pt passes PT.  DISCHARGE NEEDS: HHPT, HHRN, Walker and 3-in-1 comode seat

## 2012-11-05 NOTE — Progress Notes (Signed)
Physical Therapy Treatment Patient Details Name: Amy Thornton MRN: [id number] DOB: 14-Feb-1960 Today's Date: 11/05/2012 Time: 6606-3016 PT Time Calculation (min): 24 min  PT Assessment / Plan / Recommendation  History of Present Illness 52 y.o. female admitted to [facility] for elective R THA (posterior), WBAT.  Per pt report she has also had over a dozen spine surgeries leaving her right foot numb and 2 hip surgeries on her left side.     PT Comments   Pt is POD #2 elective posterior THA on the R.  She is moving well today and is ready to d/c home.  She will have HHPT f/u at discharge.   Follow Up Recommendations  Home health PT;Supervision for mobility/OOB     Does the patient have the potential to tolerate intense rehabilitation    Yes  Barriers to Discharge   None      Equipment Recommendations  None recommended by PT    Recommendations for Other Services   None  Frequency 7X/week   Progress towards PT Goals Progress towards PT goals: Progressing toward goals  Plan Current plan remains appropriate    Precautions / Restrictions Precautions Precautions: Posterior Hip Precaution Booklet Issued: No Precaution Comments: pt able to report 3/3 posterior hip precautions independently Restrictions Weight Bearing Restrictions: Yes RLE Weight Bearing: Weight bearing as tolerated   Pertinent Vitals/Pain See vitals flow sheet.     Mobility  Bed Mobility Bed Mobility: Not assessed (pt OOB seated in recliner chair) Transfers Transfers: Sit to Stand;Stand to Sit Sit to Stand: 6: Modified independent (Device/Increase time);With upper extremity assist;From elevated surface;With armrests;From chair/3-in-1 Stand to Sit: 6: Modified independent (Device/Increase time);With upper extremity assist;With armrests;To chair/3-in-1 Details for Transfer Assistance: pt able to stand using her arms for leverage to and from chair and to and from 3-in-1 in bathroom without difficulty.  Safe technique  demonstrated Ambulation/Gait Ambulation/Gait Assistance: 5: Supervision Ambulation Distance (Feet): 250 Feet Assistive device: Rolling walker Ambulation/Gait Assistance Details: min verbal cues for WB and safe RW use, switched her walker (std W) out for RW (wider to accomodate pt's hips) Gait Pattern: Step-through pattern;Antalgic (right foot internally rotated more than left foot.  ) General Gait Details: verbal cues for keeping right foot in neutral not toe-in gait pattern Stairs: Yes Stairs Assistance: 5: Supervision Stairs Assistance Details (indicate cue type and reason): supervision for safety, verbal cues for correct/safe technique and leg sequence.   Stair Management Technique: Two rails;Step to pattern;Forwards Number of Stairs: 5      PT Goals (current goals can now be found in the care plan section) Acute Rehab PT Goals Patient Stated Goal: to decrease right groin pain  Visit Information  Last PT Received On: 11/05/12 Assistance Needed: +1 History of Present Illness: 52 y.o. female admitted to [facility] for elective R THA (posterior), WBAT.  Per pt report she has also had over a dozen spine surgeries leaving her right foot numb and 2 hip surgeries on her left side.      Subjective Data  Subjective: Pt reports she is ready to go home as soon as her husband gets here Patient Stated Goal: to decrease right groin pain   Cognition  Cognition Arousal/Alertness: Awake/alert Behavior During Therapy: WFL for tasks assessed/performed Overall Cognitive Status: Within Functional Limits for tasks assessed       End of Session PT - End of Session Activity Tolerance: Patient tolerated treatment well Patient left: in chair;with call bell/phone within reach     [place] B. Nasif Bos,  PT, DPT #[id number]   11/05/2012, 11:15 AM

## 2012-11-05 NOTE — Plan of Care (Signed)
D/c instructions given to pt. Med scripts given to pt. Pt is stable to be d/c.

## 2012-11-05 NOTE — Progress Notes (Signed)
OT NOTE  Pt discharged prior to OT evaluation completion.   Mateo Flow   OTR/L Pager: [phone] Office: [phone] .

## 2012-11-05 NOTE — Discharge Summary (Signed)
Patient ID: Amy Thornton MRN: [id number] DOB/AGE: 09-01-1960 52 y.o.  Admit date: 11/03/2012 Discharge date: 11/05/2012  Admission Diagnoses:  Active Problems:   Osteoarthritis of right hip   Discharge Diagnoses:  Same  Past Medical History  Diagnosis Date  . Anxiety   . Osteoarthritis   . Neuromuscular disorder     pinched nerve leg    Surgeries: Procedure(s): TOTAL HIP ARTHROPLASTY- right on 11/03/2012   Consultants:    Discharged Condition: Improved  Hospital Course: Amy Thornton is an 52 y.o. female who was admitted 11/03/2012 for operative treatment of<principal problem not specified>. Patient has severe unremitting pain that affects sleep, daily activities, and work/hobbies. After pre-op clearance the patient was taken to the operating room on 11/03/2012 and underwent  Procedure(s): TOTAL HIP ARTHROPLASTY- right.    Patient was given perioperative antibiotics: Anti-infectives   Start     Dose/Rate Route Frequency Ordered Stop   11/03/12 2200  doxycycline (VIBRAMYCIN) capsule 100 mg  Status:  Discontinued     100 mg Oral 2 times daily 11/03/12 2010 11/03/12 2018   11/03/12 2200  doxycycline (VIBRA-TABS) tablet 100 mg     100 mg Oral Every 12 hours 11/03/12 2019     11/03/12 0600  clindamycin (CLEOCIN) IVPB 900 mg     900 mg 100 mL/hr over 30 Minutes Intravenous On call to O.R. 11/02/12 1416 11/03/12 1230       Patient was given sequential compression devices, early ambulation, and chemoprophylaxis to prevent DVT.  Patient benefited maximally from hospital stay and there were no complications.    Recent vital signs: Patient Vitals for the past 24 hrs:  BP Temp Temp src Pulse Resp SpO2 Height Weight  11/05/12 0557 131/48 mmHg 98.9 F (37.2 C) Oral 101 18 99 % - -  11/05/12 0400 - - - - 20 95 % - -  11/05/12 0129 138/65 mmHg 99.6 F (37.6 C) Oral 101 18 95 % - -  11/05/12 0000 - - - - 20 93 % - -  11/04/12 2058 114/57 mmHg 98.9 F (37.2 C) Oral 102 16 93 % 5\' 8"   (1.727 m) 97.8 kg (215 lb 9.8 oz)  11/04/12 2000 - - - - 16 93 % - -  11/04/12 1816 112/64 mmHg - - 99 16 98 % - -  11/04/12 1343 113/54 mmHg 99.4 F (37.4 C) Oral 96 17 99 % - -  11/04/12 0952 108/57 mmHg 99.3 F (37.4 C) Oral 97 18 100 % - -     Recent laboratory studies:  Recent Labs  11/04/12 0415  WBC 7.2  HGB 12.2  HCT 35.8*  PLT 153  NA 139  K 3.7  CL 105  CO2 27  BUN 4*  CREATININE 0.35*  GLUCOSE 121*  CALCIUM 8.2*     Discharge Medications:     Medication List         aspirin EC 325 MG tablet  Take 1 tablet (325 mg total) by mouth 2 (two) times daily.     clonazePAM 1 MG tablet  Commonly known as:  KLONOPIN  Take 1 mg by mouth 3 (three) times daily.     doxycycline 100 MG capsule  Commonly known as:  VIBRAMYCIN  Take 1 capsule (100 mg total) by mouth 2 (two) times daily.     HYDROcodone-acetaminophen 10-325 MG per tablet  Commonly known as:  NORCO  Take 1 tablet by mouth every 6 (six) hours as needed for pain.  methocarbamol 500 MG tablet  Commonly known as:  ROBAXIN  Take 1 tablet (500 mg total) by mouth 2 (two) times daily with a meal.     mupirocin ointment 2 %  Commonly known as:  BACTROBAN  Apply topically 3 (three) times daily.        Diagnostic Studies: Dg Chest 2 View  10/29/2012   *RADIOLOGY REPORT*  Clinical Data: Hypertension  CHEST - 2 VIEW  Comparison: None.  Findings: Cardiomediastinal silhouette appears normal.  No acute pulmonary disease is noted.  Bony thorax is intact. Degenerative changes of lower thoracic spine are noted.  IMPRESSION: No acute cardiopulmonary abnormality seen.   Original Report Authenticated By: Lupita Raider.,  M.D.   Dg Pelvis Portable  11/03/2012   *RADIOLOGY REPORT*  Clinical Data: Postoperative exam, right total hip arthroplasty  PORTABLE PELVIS  Comparison: None.  Findings: Two views demonstrate an bilateral total hip arthroplasties with gas overlying soft tissues of the right hip. Lumbar fusion  hardware partly visualized.  No evidence for hardware failure.  No displaced pelvic fracture.  IMPRESSION: Postoperative exam as above.   Original Report Authenticated By: Christiana Pellant, M.D.    Disposition: 01-Home or Self Care      Discharge Orders   Future Orders Complete By Expires   Call MD / Call 911  As directed    Comments:     If you experience chest pain or shortness of breath, CALL 911 and be transported to the hospital emergency room.  If you develope a fever above 101 F, pus (white drainage) or increased drainage or redness at the wound, or calf pain, call your surgeon's office.   Change dressing  As directed    Comments:     You may change your dressing on day 5, then change the dressing daily with sterile 4 x 4 inch gauze dressing and paper tape.  You may clean the incision with alcohol prior to redressing   Constipation Prevention  As directed    Comments:     Drink plenty of fluids.  Prune juice may be helpful.  You may use a stool softener, such as Colace (over the counter) 100 mg twice a day.  Use MiraLax (over the counter) for constipation as needed.   Diet - low sodium heart healthy  As directed    Discharge instructions  As directed    Comments:     Follow up 2 weeks with Dr. Turner Daniels in office   Driving restrictions  As directed    Comments:     No driving for 2 weeks   Follow the hip precautions as taught in Physical Therapy  As directed    Increase activity slowly as tolerated  As directed    Patient may shower  As directed    Comments:     You may shower without a dressing once there is no drainage.  Do not wash over the wound.  If drainage remains, cover wound with plastic wrap and then shower.      Follow-up Information   Follow up with [name], MD In 2 weeks.   Specialty:  Orthopedic Surgery   Contact information:   [address] [place] [address] [phone]        Signed: Henry Russel 11/05/2012, 8:18 AM

## 2012-11-06 DIAGNOSIS — Z471 Aftercare following joint replacement surgery: Secondary | ICD-10-CM | POA: Diagnosis not present

## 2012-11-06 DIAGNOSIS — Z96649 Presence of unspecified artificial hip joint: Secondary | ICD-10-CM | POA: Diagnosis not present

## 2012-11-06 DIAGNOSIS — IMO0001 Reserved for inherently not codable concepts without codable children: Secondary | ICD-10-CM | POA: Diagnosis not present

## 2012-11-07 DIAGNOSIS — Z471 Aftercare following joint replacement surgery: Secondary | ICD-10-CM | POA: Diagnosis not present

## 2012-11-07 DIAGNOSIS — Z96649 Presence of unspecified artificial hip joint: Secondary | ICD-10-CM | POA: Diagnosis not present

## 2012-11-07 DIAGNOSIS — IMO0001 Reserved for inherently not codable concepts without codable children: Secondary | ICD-10-CM | POA: Diagnosis not present

## 2012-11-10 DIAGNOSIS — Z96649 Presence of unspecified artificial hip joint: Secondary | ICD-10-CM | POA: Diagnosis not present

## 2012-11-10 DIAGNOSIS — IMO0001 Reserved for inherently not codable concepts without codable children: Secondary | ICD-10-CM | POA: Diagnosis not present

## 2012-11-10 DIAGNOSIS — Z471 Aftercare following joint replacement surgery: Secondary | ICD-10-CM | POA: Diagnosis not present

## 2012-11-14 DIAGNOSIS — Z96649 Presence of unspecified artificial hip joint: Secondary | ICD-10-CM | POA: Diagnosis not present

## 2012-11-14 DIAGNOSIS — Z471 Aftercare following joint replacement surgery: Secondary | ICD-10-CM | POA: Diagnosis not present

## 2012-11-14 DIAGNOSIS — IMO0001 Reserved for inherently not codable concepts without codable children: Secondary | ICD-10-CM | POA: Diagnosis not present

## 2012-11-17 DIAGNOSIS — Z471 Aftercare following joint replacement surgery: Secondary | ICD-10-CM | POA: Diagnosis not present

## 2012-11-17 DIAGNOSIS — Z96649 Presence of unspecified artificial hip joint: Secondary | ICD-10-CM | POA: Diagnosis not present

## 2012-11-17 DIAGNOSIS — IMO0001 Reserved for inherently not codable concepts without codable children: Secondary | ICD-10-CM | POA: Diagnosis not present

## 2012-11-18 DIAGNOSIS — M25559 Pain in unspecified hip: Secondary | ICD-10-CM | POA: Diagnosis not present

## 2012-11-20 DIAGNOSIS — Z471 Aftercare following joint replacement surgery: Secondary | ICD-10-CM | POA: Diagnosis not present

## 2012-11-20 DIAGNOSIS — IMO0001 Reserved for inherently not codable concepts without codable children: Secondary | ICD-10-CM | POA: Diagnosis not present

## 2012-11-20 DIAGNOSIS — Z96649 Presence of unspecified artificial hip joint: Secondary | ICD-10-CM | POA: Diagnosis not present

## 2012-11-25 DIAGNOSIS — Z471 Aftercare following joint replacement surgery: Secondary | ICD-10-CM | POA: Diagnosis not present

## 2012-11-25 DIAGNOSIS — IMO0001 Reserved for inherently not codable concepts without codable children: Secondary | ICD-10-CM | POA: Diagnosis not present

## 2012-11-25 DIAGNOSIS — Z96649 Presence of unspecified artificial hip joint: Secondary | ICD-10-CM | POA: Diagnosis not present

## 2013-01-09 DIAGNOSIS — R030 Elevated blood-pressure reading, without diagnosis of hypertension: Secondary | ICD-10-CM | POA: Diagnosis not present

## 2013-01-09 DIAGNOSIS — J00 Acute nasopharyngitis [common cold]: Secondary | ICD-10-CM | POA: Diagnosis not present

## 2013-01-09 DIAGNOSIS — H65199 Other acute nonsuppurative otitis media, unspecified ear: Secondary | ICD-10-CM | POA: Diagnosis not present

## 2013-01-09 DIAGNOSIS — H9209 Otalgia, unspecified ear: Secondary | ICD-10-CM | POA: Diagnosis not present

## 2013-03-05 DIAGNOSIS — F330 Major depressive disorder, recurrent, mild: Secondary | ICD-10-CM | POA: Diagnosis not present

## 2013-06-04 DIAGNOSIS — F331 Major depressive disorder, recurrent, moderate: Secondary | ICD-10-CM | POA: Diagnosis not present

## 2013-09-02 ENCOUNTER — Ambulatory Visit (INDEPENDENT_AMBULATORY_CARE_PROVIDER_SITE_OTHER): Payer: Medicare Other | Admitting: Family Medicine

## 2013-09-02 ENCOUNTER — Encounter: Payer: Self-pay | Admitting: Family Medicine

## 2013-09-02 VITALS — BP 144/80 | HR 87 | Temp 99.3°F | Wt 205.0 lb

## 2013-09-02 DIAGNOSIS — L259 Unspecified contact dermatitis, unspecified cause: Secondary | ICD-10-CM

## 2013-09-02 MED ORDER — PREDNISONE 20 MG PO TABS: ORAL_TABLET | ORAL | Status: AC

## 2013-09-02 MED ORDER — HYDROXYZINE HCL 50 MG PO TABS: 50.0000 mg | ORAL_TABLET | Freq: Three times a day (TID) | ORAL | Status: DC | PRN

## 2013-09-02 MED ORDER — METHYLPREDNISOLONE SODIUM SUCC 125 MG IJ SOLR
125.0000 mg | Freq: Once | INTRAMUSCULAR | Status: AC
  Administered 2013-09-02: 125 mg via INTRAMUSCULAR

## 2013-09-02 NOTE — Addendum Note (Signed)
Addended by: Wyline BeadyMCCRIMMON, ANDREA C on: 09/02/2013 04:06 PM   Modules accepted: Orders

## 2013-09-02 NOTE — Progress Notes (Signed)
CC: Amy Thornton is a 53 y.o. female is here for Rash   Subjective: HPI:  Complains of rash and itching is localized to the arms and has been spreading now to the torso. Symptoms began 4-5 days ago. Described as redness and raised bumps that initially started on the forearms and has been moving proximally. No benefit to judicious amounts of hydrocortisone cream. Symptoms are worse in the heat. Nothing else seems to make it better or worse no other interventions. She denies any change in personal care products or dietary habits. Denies fevers, chills, flushing, rapid heartbeat, nor angioedema   Review Of Systems Outlined In HPI  Past Medical History  Diagnosis Date  . Anxiety   . Osteoarthritis   . Neuromuscular disorder     pinched nerve leg    Past Surgical History  Procedure Laterality Date  . Partial hip arthroplasty      left x2  . Back surgery      7 Lami's, 1 fusion  . Cesarean section    . Tonsillectomy    . Joint replacement    . Total hip arthroplasty Right 11/03/2012    Procedure: TOTAL HIP ARTHROPLASTY- right;  Surgeon: Nestor LewandowskyFrank J Rowan, MD;  Location: [facility] OR;  Service: Orthopedics;  Laterality: Right;   Family History  Problem Relation Age of Onset  . Cancer Mother     breast  . Cancer Father     lung    History   Social History  . Marital Status: Single    Spouse Name: N/A    Number of Children: N/A  . Years of Education: N/A   Occupational History  . Not on file.   Social History Main Topics  . Smoking status: Current Every Day Smoker -- 1.00 packs/day for 30 years    Types: Cigarettes  . Smokeless tobacco: Never Used  . Alcohol Use: 6.0 oz/week    10 Cans of beer per week  . Drug Use: No  . Sexual Activity: Yes    Birth Control/ Protection: None, Post-menopausal   Other Topics Concern  . Not on file   Social History Narrative  . No narrative on file     Objective: BP 144/80  Pulse 87  Temp(Src) 99.3 F (37.4 C) (Oral)  Wt 205 lb (92.987  kg)  General: Alert and Oriented, No Acute Distress HEENT: Pupils equal, round, reactive to light. Conjunctivae clear.  Moist mucous membranes Lungs: Clear to auscultation bilaterally, no wheezing/ronchi/rales.  Comfortable work of breathing. Good air movement. Cardiac: Regular rate and rhythm. Normal S1/S2.  No murmurs, rubs, nor gallops.   Extremities: No peripheral edema.    Mental Status: No depression, anxiety, nor agitation. Skin: Warm and dry. Maculopapular rash affecting the forearms and proximal upper extremities to a moderate degree with a mild involvement of her shoulders and scapular regions. No skin abnormalities on the hands, or feet. Assessment & Plan: Kristien was seen today for rash.  Diagnoses and associated orders for this visit:  Contact dermatitis - predniSONE (DELTASONE) 20 MG tablet; Three tabs daily days 1-3, two tabs daily days 4-6, one tab daily days 7-9, half tab daily days 10-13. - hydrOXYzine (ATARAX/VISTARIL) 50 MG tablet; Take 1 tablet (50 mg total) by mouth 3 (three) times daily as needed.    Contact dermatitis: Encouraged her to look into any change in individual ingredients with her personal care products, Solu-Medrol given today along with prednisone taper and as needed hydroxyzine with sedation warning provided  Return  if symptoms worsen or fail to improve.

## 2013-11-10 DIAGNOSIS — F319 Bipolar disorder, unspecified: Secondary | ICD-10-CM | POA: Diagnosis not present

## 2014-01-28 ENCOUNTER — Encounter: Payer: Self-pay | Admitting: Sports Medicine

## 2014-01-28 ENCOUNTER — Ambulatory Visit (INDEPENDENT_AMBULATORY_CARE_PROVIDER_SITE_OTHER): Payer: Medicare Other | Admitting: Sports Medicine

## 2014-01-28 VITALS — BP 129/72 | HR 98 | Ht 68.0 in | Wt 207.0 lb

## 2014-01-28 DIAGNOSIS — B351 Tinea unguium: Secondary | ICD-10-CM

## 2014-01-28 DIAGNOSIS — R740 Nonspecific elevation of levels of transaminase and lactic acid dehydrogenase [LDH]: Secondary | ICD-10-CM | POA: Diagnosis not present

## 2014-01-28 DIAGNOSIS — Z96641 Presence of right artificial hip joint: Secondary | ICD-10-CM | POA: Diagnosis not present

## 2014-01-28 DIAGNOSIS — B353 Tinea pedis: Secondary | ICD-10-CM

## 2014-01-28 DIAGNOSIS — L0292 Furuncle, unspecified: Secondary | ICD-10-CM | POA: Diagnosis not present

## 2014-01-28 DIAGNOSIS — R7401 Elevation of levels of liver transaminase levels: Secondary | ICD-10-CM

## 2014-01-28 MED ORDER — TERBINAFINE HCL 250 MG PO TABS: 250.0000 mg | ORAL_TABLET | Freq: Every day | ORAL | Status: AC

## 2014-01-28 MED ORDER — DOXYCYCLINE HYCLATE 100 MG PO TABS: 100.0000 mg | ORAL_TABLET | Freq: Two times a day (BID) | ORAL | Status: AC

## 2014-01-28 NOTE — Assessment & Plan Note (Signed)
Doing well extremely well post right hip arthroplasty.

## 2014-01-28 NOTE — Assessment & Plan Note (Signed)
Doxycycline for 7 days

## 2014-01-28 NOTE — Assessment & Plan Note (Signed)
Lamisil for 3-6 months. CMET.

## 2014-01-28 NOTE — Assessment & Plan Note (Signed)
Lamisil for 4 weeks. Return in 1 month.

## 2014-01-28 NOTE — Progress Notes (Signed)
  Subjective:    CC: Foot and leg infection  HPI: Leg infection: Right-sided, there is a tender nodule below the skin just proximal to the lateral malleolus.  Foot infection: Right foot is scaling, skin is thickened, burning, toenails are also very thickened and yellow. Symptoms are moderate, persistent without radiation.  Past medical history, Surgical history, Family history not pertinant except as noted below, Social history, Allergies, and medications have been entered into the medical record, reviewed, and no changes needed.   Review of Systems: No fevers, chills, night sweats, weight loss, chest pain, or shortness of breath.   Objective:    General: Well Developed, well nourished, and in no acute distress.  Neuro: Alert and oriented x3, extra-ocular muscles intact, sensation grossly intact.  HEENT: Normocephalic, atraumatic, pupils equal round reactive to light, neck supple, no masses, no lymphadenopathy, thyroid nonpalpable.  Skin: Warm and dry, no rashes. Cardiac: Regular rate and rhythm, no murmurs rubs or gallops, no lower extremity edema.  Respiratory: Clear to auscultation bilaterally. Not using accessory muscles, speaking in full sentences. Right leg: There is some superficial ulceration with a 1 cm nodule that is tender to palpation, only mild superficial edema. The foot itself is scaling, mildly erythematous, nails are extremely thickened yellow.  Impression and Recommendations:

## 2014-01-29 ENCOUNTER — Other Ambulatory Visit: Payer: Self-pay | Admitting: Sports Medicine

## 2014-01-29 DIAGNOSIS — Z1159 Encounter for screening for other viral diseases: Secondary | ICD-10-CM

## 2014-01-29 DIAGNOSIS — R7989 Other specified abnormal findings of blood chemistry: Secondary | ICD-10-CM

## 2014-01-29 DIAGNOSIS — K7290 Hepatic failure, unspecified without coma: Secondary | ICD-10-CM | POA: Insufficient documentation

## 2014-01-29 DIAGNOSIS — R7401 Elevation of levels of liver transaminase levels: Secondary | ICD-10-CM

## 2014-01-29 DIAGNOSIS — B351 Tinea unguium: Secondary | ICD-10-CM

## 2014-01-29 DIAGNOSIS — R945 Abnormal results of liver function studies: Principal | ICD-10-CM

## 2014-01-29 DIAGNOSIS — R740 Nonspecific elevation of levels of transaminase and lactic acid dehydrogenase [LDH]: Secondary | ICD-10-CM

## 2014-01-29 LAB — COMPREHENSIVE METABOLIC PANEL
ALT: 68 U/L — ABNORMAL HIGH (ref 0–35)
AST: 104 U/L — ABNORMAL HIGH (ref 0–37)
Albumin: 4 g/dL (ref 3.5–5.2)
Alkaline Phosphatase: 90 U/L (ref 39–117)
BUN: 9 mg/dL (ref 6–23)
CO2: 26 mEq/L (ref 19–32)
Calcium: 8.9 mg/dL (ref 8.4–10.5)
Chloride: 104 mEq/L (ref 96–112)
Creat: 0.55 mg/dL (ref 0.50–1.10)
Glucose, Bld: 89 mg/dL (ref 70–99)
Potassium: 4.6 mEq/L (ref 3.5–5.3)
Sodium: 142 mEq/L (ref 135–145)
Total Bilirubin: 0.5 mg/dL (ref 0.2–1.2)
Total Protein: 6.8 g/dL (ref 6.0–8.3)

## 2014-01-29 NOTE — Addendum Note (Signed)
Addended by: Monica BectonHEKKEKANDAM, THOMAS J on: 01/29/2014 09:11 AM   Modules accepted: Orders

## 2014-01-29 NOTE — Assessment & Plan Note (Signed)
Liver function is elevated, hold off on starting Lamisil, and we are going to recheck liver function and an acute hepatitis panel. Patient needs to return to the lab to have blood redrawn, if persistently elevated we will need to workup the abnormal liver function

## 2014-02-09 DIAGNOSIS — F339 Major depressive disorder, recurrent, unspecified: Secondary | ICD-10-CM | POA: Diagnosis not present

## 2014-02-25 ENCOUNTER — Ambulatory Visit: Payer: Medicare Other | Admitting: Sports Medicine

## 2014-04-09 ENCOUNTER — Emergency Department (INDEPENDENT_AMBULATORY_CARE_PROVIDER_SITE_OTHER)
Admission: EM | Admit: 2014-04-09 | Discharge: 2014-04-09 | Disposition: A | Discharge status: Home / Self Care | Payer: Medicare Other | Source: Home / Self Care | Attending: Family Medicine | Admitting: Family Medicine

## 2014-04-09 ENCOUNTER — Encounter: Payer: Self-pay | Admitting: *Deleted

## 2014-04-09 ENCOUNTER — Emergency Department (INDEPENDENT_AMBULATORY_CARE_PROVIDER_SITE_OTHER): Payer: Medicare Other

## 2014-04-09 DIAGNOSIS — J40 Bronchitis, not specified as acute or chronic: Secondary | ICD-10-CM | POA: Diagnosis not present

## 2014-04-09 DIAGNOSIS — R05 Cough: Secondary | ICD-10-CM

## 2014-04-09 DIAGNOSIS — J209 Acute bronchitis, unspecified: Secondary | ICD-10-CM

## 2014-04-09 DIAGNOSIS — R040 Epistaxis: Secondary | ICD-10-CM

## 2014-04-09 MED ORDER — ALBUTEROL SULFATE HFA 108 (90 BASE) MCG/ACT IN AERS: 1.0000 | INHALATION_SPRAY | Freq: Four times a day (QID) | RESPIRATORY_TRACT | Status: DC | PRN

## 2014-04-09 MED ORDER — HYDROCODONE-ACETAMINOPHEN 5-325 MG PO TABS: 1.0000 | ORAL_TABLET | Freq: Every evening | ORAL | Status: DC | PRN

## 2014-04-09 MED ORDER — PREDNISONE 10 MG PO TABS: 30.0000 mg | ORAL_TABLET | Freq: Every day | ORAL | Status: DC

## 2014-04-09 MED ORDER — IPRATROPIUM-ALBUTEROL 0.5-2.5 (3) MG/3ML IN SOLN
3.0000 mL | Freq: Once | RESPIRATORY_TRACT | Status: AC
  Administered 2014-04-09: 3 mL via RESPIRATORY_TRACT

## 2014-04-09 MED ORDER — AZITHROMYCIN 250 MG PO TABS: 250.0000 mg | ORAL_TABLET | Freq: Every day | ORAL | Status: DC

## 2014-04-09 NOTE — ED Notes (Signed)
Amy Thornton c/o 5 days of non-productive cough, epistaxis, ear pain, low grade fever, t-max 102, and aches. Taken Mucinex otc and Advil.

## 2014-04-09 NOTE — Discharge Instructions (Signed)
Thank you for coming in today. Call or go to the emergency room if you get worse, have trouble breathing, have chest pains, or palpitations.  Do not drive after taking hydrocodone. Please quit Smoking.    Acute Bronchitis Bronchitis is inflammation of the airways that extend from the windpipe into the lungs (bronchi). The inflammation often causes mucus to develop. This leads to a cough, which is the most common symptom of bronchitis.  In acute bronchitis, the condition usually develops suddenly and goes away over time, usually in a couple weeks. Smoking, allergies, and asthma can make bronchitis worse. Repeated episodes of bronchitis may cause further lung problems.  CAUSES Acute bronchitis is most often caused by the same virus that causes a cold. The virus can spread from person to person (contagious) through coughing, sneezing, and touching contaminated objects. SIGNS AND SYMPTOMS   Cough.   Fever.   Coughing up mucus.   Body aches.   Chest congestion.   Chills.   Shortness of breath.   Sore throat.  DIAGNOSIS  Acute bronchitis is usually diagnosed through a physical exam. Your health care provider will also ask you questions about your medical history. Tests, such as chest X-rays, are sometimes done to rule out other conditions.  TREATMENT  Acute bronchitis usually goes away in a couple weeks. Oftentimes, no medical treatment is necessary. Medicines are sometimes given for relief of fever or cough. Antibiotic medicines are usually not needed but may be prescribed in certain situations. In some cases, an inhaler may be recommended to help reduce shortness of breath and control the cough. A cool mist vaporizer may also be used to help thin bronchial secretions and make it easier to clear the chest.  HOME CARE INSTRUCTIONS  Get plenty of rest.   Drink enough fluids to keep your urine clear or pale yellow (unless you have a medical condition that requires fluid  restriction). Increasing fluids may help thin your respiratory secretions (sputum) and reduce chest congestion, and it will prevent dehydration.   Take medicines only as directed by your health care provider.  If you were prescribed an antibiotic medicine, finish it all even if you start to feel better.  Avoid smoking and secondhand smoke. Exposure to cigarette smoke or irritating chemicals will make bronchitis worse. If you are a smoker, consider using nicotine gum or skin patches to help control withdrawal symptoms. Quitting smoking will help your lungs heal faster.   Reduce the chances of another bout of acute bronchitis by washing your hands frequently, avoiding people with cold symptoms, and trying not to touch your hands to your mouth, nose, or eyes.   Keep all follow-up visits as directed by your health care provider.  SEEK MEDICAL CARE IF: Your symptoms do not improve after 1 week of treatment.  SEEK IMMEDIATE MEDICAL CARE IF:  You develop an increased fever or chills.   You have chest pain.   You have severe shortness of breath.  You have bloody sputum.   You develop dehydration.  You faint or repeatedly feel like you are going to pass out.  You develop repeated vomiting.  You develop a severe headache. MAKE SURE YOU:   Understand these instructions.  Will watch your condition.  Will get help right away if you are not doing well or get worse. Document Released: 03/08/2004 Document Revised: 06/15/2013 Document Reviewed: 07/22/2012 [facility] Patient Information 2015 [place], [place]. This information is not intended to replace advice given to you by your health care  provider. Make sure you discuss any questions you have with your health care provider. ° °

## 2014-04-09 NOTE — ED Provider Notes (Signed)
Amy Thornton is a 54 y.o. female who presents to Urgent Care today for 5 days of cough congestion wheezing nose bleeding chest tightness and shortness of breath. She does note some wheezing. She notes that the cough was not productive. No vomiting or diarrhea. She has tried some muccinex which helped some. Patient feels well otherwise.   Past Medical History  Diagnosis Date  . Anxiety   . Osteoarthritis   . Neuromuscular disorder     pinched nerve leg   Past Surgical History  Procedure Laterality Date  . Partial hip arthroplasty      left x2  . Back surgery      7 Lami's, 1 fusion  . Cesarean section    . Tonsillectomy    . Joint replacement    . Total hip arthroplasty Right 11/03/2012    Procedure: TOTAL HIP ARTHROPLASTY- right;  Surgeon: Nestor LewandowskyFrank J Rowan, MD;  Location: [facility] OR;  Service: Orthopedics;  Laterality: Right;   History  Substance Use Topics  . Smoking status: Current Every Day Smoker -- 1.00 packs/day for 30 years    Types: Cigarettes  . Smokeless tobacco: Never Used  . Alcohol Use: 6.0 oz/week    10 Cans of beer per week   ROS as above Medications: No current facility-administered medications for this encounter.   Current Outpatient Prescriptions  Medication Sig Dispense Refill  . albuterol (PROVENTIL HFA;VENTOLIN HFA) 108 (90 BASE) MCG/ACT inhaler Inhale 1-2 puffs into the lungs every 6 (six) hours as needed for wheezing or shortness of breath. 1 Inhaler 0  . azithromycin (ZITHROMAX) 250 MG tablet Take 1 tablet (250 mg total) by mouth daily. Take first 2 tablets together, then 1 every day until finished. 6 tablet 0  . clonazePAM (KLONOPIN) 1 MG tablet Take 1 mg by mouth 3 (three) times daily.     Marland Kitchen. HYDROcodone-acetaminophen (NORCO/VICODIN) 5-325 MG per tablet Take 1 tablet by mouth at bedtime as needed (cough). 10 tablet 0  . predniSONE (DELTASONE) 10 MG tablet Take 3 tablets (30 mg total) by mouth daily. 15 tablet 0  . terbinafine (LAMISIL) 250 MG tablet Take 1 tablet  (250 mg total) by mouth daily. 90 tablet 1   Allergies  Allergen Reactions  . Augmentin [Amoxicillin-Pot Clavulanate] Nausea And Vomiting  . Morphine And Related Hives and Itching     Exam:  BP 125/85 mmHg  Pulse 102  Temp(Src) 98.8 F (37.1 C) (Oral)  Resp 16  Wt 200 lb (90.719 kg)  SpO2 94% Gen: Well NAD nontoxic appearing HEENT: EOMI,  MMM . Pharynx cobblestoning. Normal tympanic membranes bilaterally Lungs: Normal work of breathing. Wheezing and prolonged expiratory phase present bilaterally Heart: RRR no MRG Abd: NABS, Soft. Nondistended, Nontender Exts: Brisk capillary refill, warm and well perfused.    Patient was given a 2.5/0.5 mg DuoNeb nebulizer treatment, and felt a little better   No results found for this or any previous visit (from the past 24 hour(s)). Dg Chest 2 View  04/09/2014   CLINICAL DATA:  Cough, nosebleed  EXAM: CHEST  2 VIEW  COMPARISON:  10/29/2012  FINDINGS: Cardiac shadow is at the upper limits of normal in size. The lungs are well aerated bilaterally. No focal infiltrate is seen. Some very mild bronchitic changes are noted. No bony abnormality is seen.  IMPRESSION: Mild bronchitic changes which may be chronic in nature. No focal infiltrate is seen.   Electronically Signed   By: Alcide CleverMark  Lukens M.D.   On: 04/09/2014 16:48  Assessment and Plan: 54 y.o. female with bronchitis. Likely viral. Smoking is a factor. Treat with prednisone, albuterol, and azithromycin. Use hydrocodone for cough suppression at bedtime as patient does not like taking liquids. Return as needed. Patient may benefit from formal PFTs via primary care provider in the near future. Encouraged smoking cessation.  Discussed warning signs or symptoms. Please see discharge instructions. Patient expresses understanding.     Rodolph Bong, MD 04/09/14 [phone]

## 2014-06-10 DIAGNOSIS — F333 Major depressive disorder, recurrent, severe with psychotic symptoms: Secondary | ICD-10-CM | POA: Diagnosis not present

## 2014-09-09 DIAGNOSIS — M5441 Lumbago with sciatica, right side: Secondary | ICD-10-CM | POA: Diagnosis not present

## 2014-09-09 DIAGNOSIS — M545 Low back pain: Secondary | ICD-10-CM | POA: Diagnosis not present

## 2014-09-10 ENCOUNTER — Other Ambulatory Visit: Payer: Self-pay | Admitting: Orthopedic Surgery

## 2014-09-10 DIAGNOSIS — M545 Low back pain, unspecified: Secondary | ICD-10-CM

## 2014-09-10 DIAGNOSIS — G8929 Other chronic pain: Secondary | ICD-10-CM

## 2014-09-15 ENCOUNTER — Ambulatory Visit
Admission: RE | Admit: 2014-09-15 | Discharge: 2014-09-15 | Disposition: A | Discharge status: Home / Self Care | Payer: Medicare Other | Source: Ambulatory Visit | Attending: Orthopedic Surgery | Admitting: Orthopedic Surgery

## 2014-09-15 DIAGNOSIS — M545 Low back pain, unspecified: Secondary | ICD-10-CM

## 2014-09-15 DIAGNOSIS — G8929 Other chronic pain: Secondary | ICD-10-CM

## 2014-09-15 MED ORDER — IOHEXOL 180 MG/ML  SOLN: 15.0000 mL | Freq: Once | INTRAMUSCULAR | Status: AC | PRN

## 2014-09-15 MED ORDER — DIAZEPAM 5 MG PO TABS: 10.0000 mg | ORAL_TABLET | Freq: Once | ORAL | Status: DC

## 2014-09-15 NOTE — Discharge Instructions (Signed)

## 2014-09-16 NOTE — Progress Notes (Signed)
Called Amy Thornton at Dr. Melvia Heaps office and explained that we could not do the pt's myelogram.  Due to the fact pt stated she has an active infection in her lower leg that has never completely gone away. Bjorn Loser will have pt come in to have her leg looked at.

## 2014-09-21 ENCOUNTER — Encounter: Payer: Self-pay | Admitting: Sports Medicine

## 2014-09-21 ENCOUNTER — Ambulatory Visit (INDEPENDENT_AMBULATORY_CARE_PROVIDER_SITE_OTHER): Payer: Medicare Other | Admitting: Sports Medicine

## 2014-09-21 DIAGNOSIS — L0292 Furuncle, unspecified: Secondary | ICD-10-CM | POA: Diagnosis not present

## 2014-09-21 MED ORDER — DOXYCYCLINE HYCLATE 100 MG PO TABS: 100.0000 mg | ORAL_TABLET | Freq: Two times a day (BID) | ORAL | Status: AC

## 2014-09-21 NOTE — Progress Notes (Signed)
  Subjective:    CC: follow-up  HPI: Amy Thornton returns, she is post-hip arthroplasty and doing well.  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  Past medical history, Surgical history, Family history not pertinant except as noted below, Social history, Allergies, and medications have been entered into the medical record, reviewed, and no changes needed.   Review of Systems: No fevers, chills, night sweats, weight loss, chest pain, or shortness of breath.   Objective:    General: Well Developed, well nourished, and in no acute distress.  Neuro: Alert and oriented x3, extra-ocular muscles intact, sensation grossly intact.  HEENT: Normocephalic, atraumatic, pupils equal round reactive to light, neck supple, no masses, no lymphadenopathy, thyroid nonpalpable.  Skin: Warm and dry, no rashes.  there is a for uncle over the right lateral ankle with minimal surrounding erythema, no drainage, and overlying scab. No palpable fluctuance. Cardiac: Regular rate and rhythm, no murmurs rubs or gallops, no lower extremity edema.  Respiratory: Clear to auscultation bilaterally. Not using accessory muscles, speaking in full sentences.  Impression and Recommendations:    I spent 25 minutes with this patient, greater than 50% was face-to-face time counseling regarding the above diagnoses

## 2014-09-21 NOTE — Assessment & Plan Note (Signed)
Recurrent, continue topical anti-biotics, adding 10 days of oral doxycycline.

## 2014-09-24 ENCOUNTER — Telehealth: Payer: Self-pay | Admitting: Sports Medicine

## 2014-09-24 NOTE — Telephone Encounter (Signed)
This is a fairly common adverse effect from antibiotics, finish the antibiotics, and take Imodium.

## 2014-09-24 NOTE — Telephone Encounter (Signed)
Pt called clinic to see if she can get a different antibiotic, states she has "white water coming out of her butt" and she is unable to even leave her home. Will route to Provider for review.

## 2014-09-24 NOTE — Telephone Encounter (Signed)
Pt advised of recommendations, verbalized understanding. No further questions.  

## 2014-10-01 ENCOUNTER — Ambulatory Visit: Payer: Medicare Other | Admitting: Sports Medicine

## 2014-10-05 DIAGNOSIS — F333 Major depressive disorder, recurrent, severe with psychotic symptoms: Secondary | ICD-10-CM | POA: Diagnosis not present

## 2014-10-07 ENCOUNTER — Ambulatory Visit (INDEPENDENT_AMBULATORY_CARE_PROVIDER_SITE_OTHER): Payer: Medicare Other | Admitting: Sports Medicine

## 2014-10-07 ENCOUNTER — Encounter: Payer: Self-pay | Admitting: Sports Medicine

## 2014-10-07 DIAGNOSIS — L0292 Furuncle, unspecified: Secondary | ICD-10-CM

## 2014-10-07 MED ORDER — MELOXICAM 15 MG PO TABS: ORAL_TABLET | ORAL | Status: DC

## 2014-10-07 NOTE — Progress Notes (Signed)
  Subjective:    CC: Follow-up  HPI: Right leg furuncle: Resolved with doxycycline.  Past medical history, Surgical history, Family history not pertinant except as noted below, Social history, Allergies, and medications have been entered into the medical record, reviewed, and no changes needed.   Review of Systems: No fevers, chills, night sweats, weight loss, chest pain, or shortness of breath.   Objective:    General: Well Developed, well nourished, and in no acute distress.  Neuro: Alert and oriented x3, extra-ocular muscles intact, sensation grossly intact.  HEENT: Normocephalic, atraumatic, pupils equal round reactive to light, neck supple, no masses, no lymphadenopathy, thyroid nonpalpable.  Skin: Warm and dry, no rashes. Cardiac: Regular rate and rhythm, no murmurs rubs or gallops, no lower extremity edema.  Respiratory: Clear to auscultation bilaterally. Not using accessory muscles, speaking in full sentences. Right leg: Visible postinflammatory hyperpigmentation however signs of infection have resolved.  Impression and Recommendations:

## 2014-10-07 NOTE — Assessment & Plan Note (Signed)
Improved significantly with topical and biotics and oral doxycycline. No further management needed.

## 2014-10-14 ENCOUNTER — Other Ambulatory Visit: Payer: Self-pay | Admitting: Orthopedic Surgery

## 2014-10-14 DIAGNOSIS — M545 Low back pain, unspecified: Secondary | ICD-10-CM

## 2014-10-14 DIAGNOSIS — G8929 Other chronic pain: Secondary | ICD-10-CM

## 2014-11-02 ENCOUNTER — Other Ambulatory Visit: Payer: Medicare Other

## 2014-11-12 ENCOUNTER — Inpatient Hospital Stay
Admission: RE | Admit: 2014-11-12 | Discharge: 2014-11-12 | Disposition: A | Discharge status: Home / Self Care | Payer: Medicare Other | Source: Ambulatory Visit | Attending: Orthopedic Surgery | Admitting: Orthopedic Surgery

## 2014-11-12 ENCOUNTER — Other Ambulatory Visit: Payer: Medicare Other

## 2014-11-12 NOTE — Discharge Instructions (Signed)

## 2014-11-26 ENCOUNTER — Ambulatory Visit
Admission: RE | Admit: 2014-11-26 | Discharge: 2014-11-26 | Disposition: A | Discharge status: Home / Self Care | Payer: Medicare Other | Source: Ambulatory Visit | Attending: Orthopedic Surgery | Admitting: Orthopedic Surgery

## 2014-11-26 VITALS — BP 129/64 | HR 91

## 2014-11-26 DIAGNOSIS — M5416 Radiculopathy, lumbar region: Secondary | ICD-10-CM

## 2014-11-26 DIAGNOSIS — M545 Low back pain, unspecified: Secondary | ICD-10-CM

## 2014-11-26 DIAGNOSIS — M4806 Spinal stenosis, lumbar region: Secondary | ICD-10-CM | POA: Diagnosis not present

## 2014-11-26 DIAGNOSIS — G8929 Other chronic pain: Secondary | ICD-10-CM

## 2014-11-26 MED ORDER — HYDROXYZINE HCL 50 MG/ML IM SOLN
25.0000 mg | Freq: Once | INTRAMUSCULAR | Status: AC
  Administered 2014-11-26: 25 mg via INTRAMUSCULAR

## 2014-11-26 MED ORDER — DIAZEPAM 5 MG PO TABS: 10.0000 mg | ORAL_TABLET | Freq: Once | ORAL | Status: DC

## 2014-11-26 MED ORDER — MEPERIDINE HCL 100 MG/ML IJ SOLN
50.0000 mg | Freq: Once | INTRAMUSCULAR | Status: AC
  Administered 2014-11-26: 25 mg via INTRAMUSCULAR

## 2014-11-26 MED ORDER — IOHEXOL 180 MG/ML  SOLN
15.0000 mL | Freq: Once | INTRAMUSCULAR | Status: DC | PRN
  Administered 2014-11-26: 15 mL via INTRATHECAL

## 2014-11-26 NOTE — Discharge Instructions (Signed)

## 2014-11-30 ENCOUNTER — Telehealth: Payer: Self-pay | Admitting: Radiology

## 2014-11-30 NOTE — Telephone Encounter (Signed)
Pt had a myelo on 11/26/14. Called c/o of a sore back with some bruising at the injection site. Explained that bruising and soreness were not unusual and she could alternate ice and heat to relieve some of the soreness. Also, told her it could take 7-10 days for the bruising and soreness to go away.

## 2014-12-27 DIAGNOSIS — F330 Major depressive disorder, recurrent, mild: Secondary | ICD-10-CM | POA: Diagnosis not present

## 2015-03-17 DIAGNOSIS — M545 Low back pain: Secondary | ICD-10-CM | POA: Diagnosis not present

## 2015-03-31 DIAGNOSIS — F330 Major depressive disorder, recurrent, mild: Secondary | ICD-10-CM | POA: Diagnosis not present

## 2015-11-17 DIAGNOSIS — F330 Major depressive disorder, recurrent, mild: Secondary | ICD-10-CM | POA: Diagnosis not present

## 2015-12-21 IMAGING — CT CT L SPINE W/ CM
1 of 6 series · 6 of 14 positions shown, 8 images · IV contrast (omnipaque)
Comparison: None

CLINICAL DATA: Multiple previous lumbar surgeries. Right lower
extremity pain and numbness.

EXAM:
LUMBAR MYELOGRAM
CT LUMBAR SPINE WITH INTRATHECAL CONTRAST
FLUOROSCOPY TIME:  49 seconds
TECHNIQUE: The procedure, risks (including but not limited to bleeding,
infection, organ damage ), benefits, and alternatives were explained
to the patient. Questions regarding the procedure were encouraged
and answered. The patient understands and consents to the procedure.
An appropriate entry site was determined under fluoroscopy. Operator
donned sterile gloves and mask. Skin site was marked, prepped with
Betadine, and draped in usual sterile fashion, and infiltrated
locally with 1% lidocaine. A 22 gauge spinal needle was advanced
into the thecal sac at L3 from a right parasagittal approach. Clear
colorless CSF returned. 17 ml Omnipaque 180 were administered
intrathecally for lumbar myelography, followed by axial CT scanning
of the lumbar spine. I personally performed the lumbar puncture and
administered the intrathecal contrast. I also personally supervised
acquisition of the myelogram images. Coronal and sagittal
reconstructions were generated from the axial scan.

[Series 3: l spine soft · axial · 0.27mm/px · z∈[-154,+11]mm · 6 of 78 slices shown, 8 images]
[im 12/78  soft-tissue]
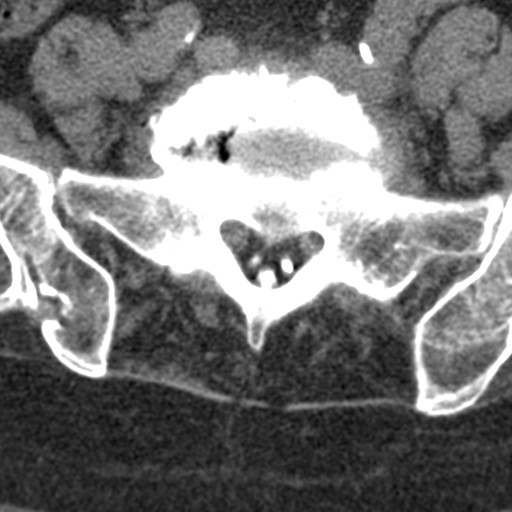
[im 12/78  bone]
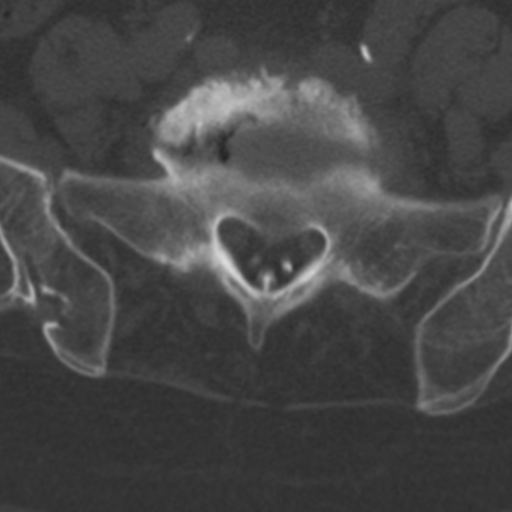
[im 23/78  bone]
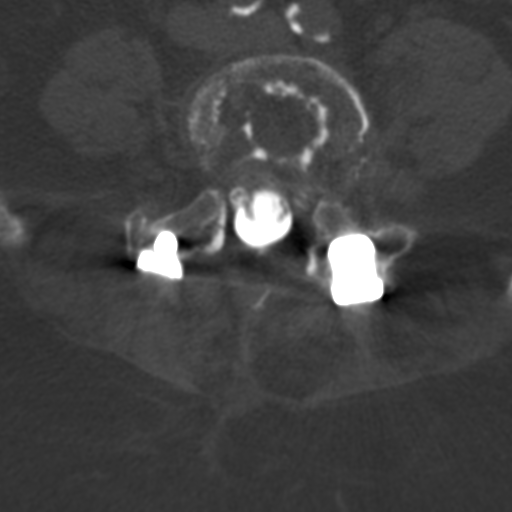
[im 34/78  bone]
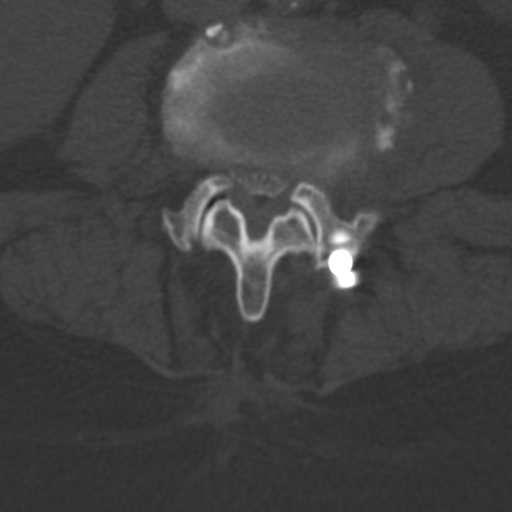
[im 45/78  bone]
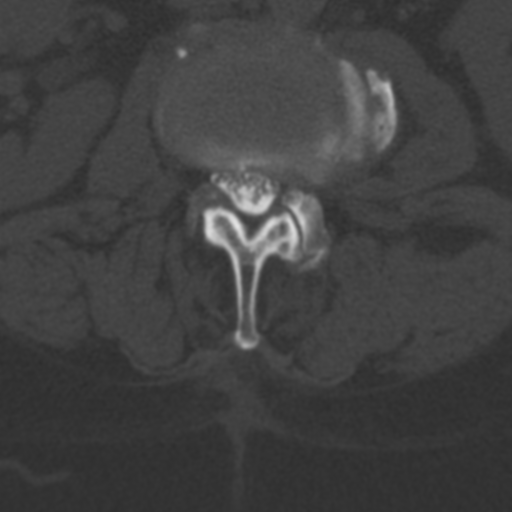
[im 56/78  soft-tissue]
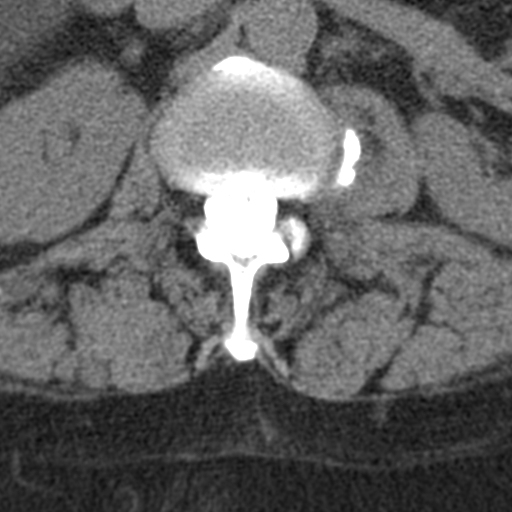
[im 56/78  bone]
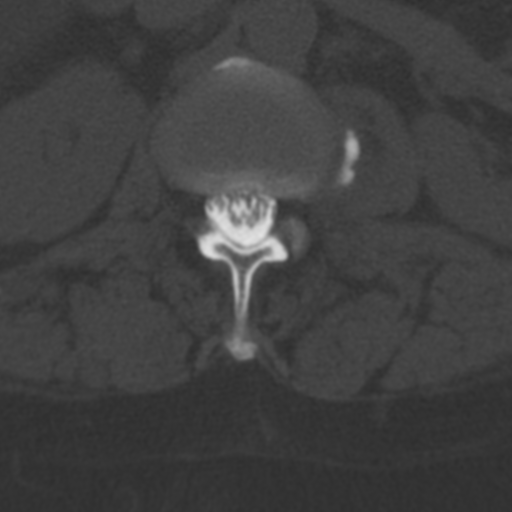
[im 67/78  bone]
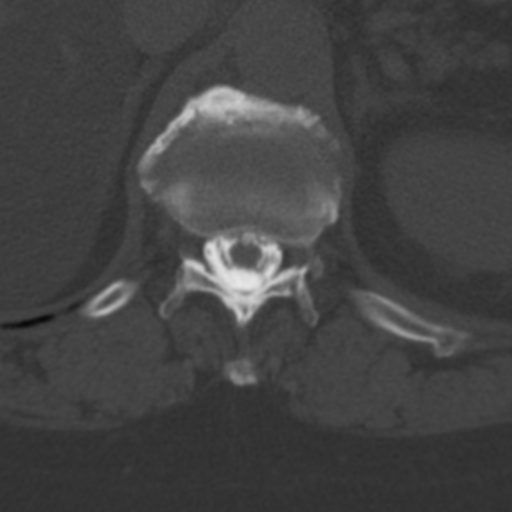

[6 of 14 positions shown; findings below may reference images not displayed]

FINDINGS: Five non rib-bearing lumbar segments labeled L1-L5. Normal
alignment. Negative for fracture.

T12-L1: Exuberant anterior endplate spurring. No disc bulge or
protrusion. Conus terminates behind L1. Central canal and foramina
patent.

L1-2: Prominent left lateral endplate osteophytes. No posterior disc
bulge or protrusion. Central canal and foramina patent. Subacute
minimally displaced fracture of the left L2 transverse process with
healing response evidenced by callus around the fracture line.

L2-3: Broad left lateral and posterolateral protrusion. Asymmetric
facet degenerative hypertrophy left worse than right contributing to
mild central canal stenosis, left subarticular recess and foraminal
narrowing.

L3-4: Moderate circumferential disc bulge. Bilateral facet
degenerative spurring and thickening of ligamentum flavum
contributing to moderate central canal stenosis and foraminal
stenosis left worse than right.

L4-5: Changes of instrumented PLIF. Bilateral pedicle screws at each
level with vertical interconnecting hardware, intact without
surrounding lucency. Solid fusion across the interspace and
posterior elements. There has been posterior decompression. Central
canal and foramina patent.

L5-S1: Mild disc bulge. Early vacuum phenomenon on the right.
Moderate bilateral facet DJD and spurring with thickening of
ligamentum flavum resulting in mild spinal stenosis and bilateral
foraminal encroachment.

Patchy aortoiliac arterial plaque. Fatty liver. Remainder visualized
paraspinal soft tissues unremarkable.
IMPRESSION: 1. Solid  PLIF L4-5.
2. Adjacent level disease at L3-4 with moderate multifactorial
spinal stenosis and asymmetric foraminal stenosis left greater than
right.
3. Mild multifactorial spinal stenosis L2-3 with left foraminal
stenosis related to eccentric disc protrusion.
4. Healing left L2 transverse process fracture. Correlate with any
recent history of trauma

## 2016-02-09 DIAGNOSIS — F330 Major depressive disorder, recurrent, mild: Secondary | ICD-10-CM | POA: Diagnosis not present

## 2016-02-22 DIAGNOSIS — S20211A Contusion of right front wall of thorax, initial encounter: Secondary | ICD-10-CM | POA: Diagnosis not present

## 2016-02-22 DIAGNOSIS — W19XXXA Unspecified fall, initial encounter: Secondary | ICD-10-CM | POA: Diagnosis not present

## 2016-02-22 DIAGNOSIS — R0989 Other specified symptoms and signs involving the circulatory and respiratory systems: Secondary | ICD-10-CM | POA: Diagnosis not present

## 2016-02-22 DIAGNOSIS — M19011 Primary osteoarthritis, right shoulder: Secondary | ICD-10-CM | POA: Diagnosis not present

## 2016-04-19 DIAGNOSIS — F330 Major depressive disorder, recurrent, mild: Secondary | ICD-10-CM | POA: Diagnosis not present

## 2016-05-26 DIAGNOSIS — R269 Unspecified abnormalities of gait and mobility: Secondary | ICD-10-CM | POA: Diagnosis not present

## 2016-05-26 DIAGNOSIS — D62 Acute posthemorrhagic anemia: Secondary | ICD-10-CM | POA: Diagnosis not present

## 2016-05-26 DIAGNOSIS — F17210 Nicotine dependence, cigarettes, uncomplicated: Secondary | ICD-10-CM | POA: Diagnosis not present

## 2016-05-26 DIAGNOSIS — S82192A Other fracture of upper end of left tibia, initial encounter for closed fracture: Secondary | ICD-10-CM | POA: Diagnosis not present

## 2016-05-26 DIAGNOSIS — S82122A Displaced fracture of lateral condyle of left tibia, initial encounter for closed fracture: Secondary | ICD-10-CM | POA: Diagnosis not present

## 2016-05-26 DIAGNOSIS — Z720 Tobacco use: Secondary | ICD-10-CM | POA: Diagnosis not present

## 2016-05-26 DIAGNOSIS — Z8719 Personal history of other diseases of the digestive system: Secondary | ICD-10-CM | POA: Diagnosis not present

## 2016-05-26 DIAGNOSIS — Z9181 History of falling: Secondary | ICD-10-CM | POA: Diagnosis not present

## 2016-05-26 DIAGNOSIS — M79605 Pain in left leg: Secondary | ICD-10-CM | POA: Diagnosis not present

## 2016-05-26 DIAGNOSIS — F419 Anxiety disorder, unspecified: Secondary | ICD-10-CM | POA: Diagnosis not present

## 2016-05-26 DIAGNOSIS — Z96642 Presence of left artificial hip joint: Secondary | ICD-10-CM | POA: Diagnosis present

## 2016-05-26 DIAGNOSIS — Z01818 Encounter for other preprocedural examination: Secondary | ICD-10-CM | POA: Diagnosis not present

## 2016-05-26 DIAGNOSIS — M25569 Pain in unspecified knee: Secondary | ICD-10-CM | POA: Diagnosis not present

## 2016-05-26 DIAGNOSIS — R740 Nonspecific elevation of levels of transaminase and lactic acid dehydrogenase [LDH]: Secondary | ICD-10-CM | POA: Diagnosis not present

## 2016-05-26 DIAGNOSIS — M7989 Other specified soft tissue disorders: Secondary | ICD-10-CM | POA: Diagnosis not present

## 2016-05-26 DIAGNOSIS — Z96641 Presence of right artificial hip joint: Secondary | ICD-10-CM | POA: Diagnosis not present

## 2016-05-26 DIAGNOSIS — Z96643 Presence of artificial hip joint, bilateral: Secondary | ICD-10-CM | POA: Diagnosis not present

## 2016-05-26 DIAGNOSIS — S82142A Displaced bicondylar fracture of left tibia, initial encounter for closed fracture: Secondary | ICD-10-CM | POA: Diagnosis not present

## 2016-06-19 DIAGNOSIS — S82142A Displaced bicondylar fracture of left tibia, initial encounter for closed fracture: Secondary | ICD-10-CM | POA: Diagnosis not present

## 2016-06-19 DIAGNOSIS — Z967 Presence of other bone and tendon implants: Secondary | ICD-10-CM | POA: Diagnosis not present

## 2016-06-19 DIAGNOSIS — Z8781 Personal history of (healed) traumatic fracture: Secondary | ICD-10-CM | POA: Diagnosis not present

## 2016-06-19 DIAGNOSIS — Z96643 Presence of artificial hip joint, bilateral: Secondary | ICD-10-CM | POA: Diagnosis not present

## 2016-07-24 DIAGNOSIS — Z9889 Other specified postprocedural states: Secondary | ICD-10-CM | POA: Diagnosis not present

## 2016-07-24 DIAGNOSIS — S82142D Displaced bicondylar fracture of left tibia, subsequent encounter for closed fracture with routine healing: Secondary | ICD-10-CM | POA: Diagnosis not present

## 2016-08-20 ENCOUNTER — Telehealth: Payer: Self-pay | Admitting: *Deleted

## 2016-08-20 NOTE — Telephone Encounter (Signed)
Pt called requesting we call in ABT cream and pills for her staph infection on her RT leg. Advised her she had not been seen in the urgent care in 4 years and we would not be able to call in the medication. She would need to be seen and evaluated. Pt agrees.

## 2016-09-27 DIAGNOSIS — S82202D Unspecified fracture of shaft of left tibia, subsequent encounter for closed fracture with routine healing: Secondary | ICD-10-CM | POA: Diagnosis not present

## 2016-09-27 DIAGNOSIS — Z967 Presence of other bone and tendon implants: Secondary | ICD-10-CM | POA: Diagnosis not present

## 2016-09-27 DIAGNOSIS — S82142D Displaced bicondylar fracture of left tibia, subsequent encounter for closed fracture with routine healing: Secondary | ICD-10-CM | POA: Diagnosis not present

## 2016-11-04 ENCOUNTER — Emergency Department (INDEPENDENT_AMBULATORY_CARE_PROVIDER_SITE_OTHER)
Admission: EM | Admit: 2016-11-04 | Discharge: 2016-11-04 | Disposition: A | Discharge status: Home / Self Care | Payer: Medicare Other | Source: Home / Self Care | Attending: Family Medicine | Admitting: Family Medicine

## 2016-11-04 ENCOUNTER — Encounter: Payer: Self-pay | Admitting: Emergency Medicine

## 2016-11-04 DIAGNOSIS — L308 Other specified dermatitis: Secondary | ICD-10-CM

## 2016-11-04 MED ORDER — PREDNISONE 20 MG PO TABS
ORAL_TABLET | ORAL | 0 refills | Status: DC
Start: 2016-11-04 — End: 2018-01-21

## 2016-11-04 MED ORDER — CLOBETASOL PROPIONATE 0.05 % EX CREA
1.0000 | TOPICAL_CREAM | Freq: Two times a day (BID) | CUTANEOUS | 1 refills | Status: DC
Start: 2016-11-04 — End: 2018-01-21

## 2016-11-04 NOTE — ED Triage Notes (Signed)
Patient presents to [facility] with what she describes as a staph infection on the lower right lateral leg. Bilateral feet edematous and scaly. Denies fever. C/O pain 5/10 and describes as stinging and burning sensation. No drainage.

## 2016-11-04 NOTE — ED Provider Notes (Signed)
[facility]    CSN: [id number] Arrival date & time: 11/04/16  1324     History   Chief Complaint Chief Complaint  Patient presents with  . Leg Pain    HPI Amy Thornton is a 56 y.o. female.   HPI Amy Thornton is a 56 y.o. 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  The area is itching and burning.  She has swelling and scaling of skin on both feet and lower legs but states her Left leg skin irritation is due to an ORIF of Left tibia earlier this year.  She saw her orthopedist last month for recheck, everything was okay with Left leg. She asked about the chronic scab, he was not concerned for skin infection. He did recommend she f/u with PCP but due to lack of transportation, her son works Monday-Friday, she cannot f/u with her PCP or any other specialists.  Denies fever, n/v/d.    Past Medical History:  Diagnosis Date  . Anxiety   . Neuromuscular disorder (HCC)    pinched nerve leg  . Osteoarthritis     Patient Active Problem List   Diagnosis Date Noted  . Transaminitis 01/29/2014  . Furuncle 01/28/2014  . Right lumbar radiculitis 10/07/2012  . 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 BACK SURGERY     7 Lami's, 1 fusion  . CESAREAN SECTION    . JOINT REPLACEMENT    . PARTIAL HIP ARTHROPLASTY     left x2  . TONSILLECTOMY    . TOTAL HIP ARTHROPLASTY Right 11/03/2012   Procedure: TOTAL HIP ARTHROPLASTY- right;  Surgeon: [name], MD;  Location: [facility] OR;  Service: Orthopedics;  Laterality: Right;    OB History    No data available       Home Medications    Prior to Admission medications   Medication Sig Start Date End Date Taking? Authorizing Provider  albuterol (PROVENTIL HFA;VENTOLIN HFA) 108 (90 BASE) MCG/ACT inhaler Inhale 1-2 puffs into the lungs every 6 (six) hours as needed for wheezing or  shortness of breath. 04/09/14   Rodolph Bong, MD  clobetasol cream (TEMOVATE) 0.05 % Apply 1 application topically 2 (two) times daily. 11/04/16   Lurene Shadow, PA-C  clonazePAM (KLONOPIN) 1 MG tablet Take 1 mg by mouth 3 (three) times daily.     [provider]  predniSONE (DELTASONE) 20 MG tablet 3 tabs po day one, then 2 po daily x 4 days 11/04/16   Lurene Shadow, PA-C    Family History Family History  Problem Relation Age of Onset  . Cancer Mother        breast  . Cancer Father        lung    Social History Social History  Substance Use Topics  . Smoking status: Current Every Day Smoker    Packs/day: 1.00    Years: 30.00    Types: Cigarettes  . Smokeless tobacco: Never Used  . Alcohol use 6.0 oz/week    10 Cans of beer per week     Allergies   Morphine and related and Augmentin [amoxicillin-pot clavulanate]   Review of Systems Review of Systems  Constitutional: Negative for chills and fever.  Musculoskeletal: Negative for arthralgias, joint swelling and myalgias.  Skin: Positive for color change, rash and wound.  Physical Exam Triage Vital Signs ED Triage Vitals  Enc Vitals Group     BP      Pulse      Resp      Temp      Temp src      SpO2      Weight      Height      Head Circumference      Peak Flow      Pain Score      Pain Loc      Pain Edu?      Excl. in GC?    No data found.   Updated Vital Signs BP 127/83 (BP Location: Left Arm)   Pulse 84   Temp 98.2 F (36.8 C) (Oral)   Resp 16   Ht  (1.727 m)   Wt 195 lb (88.5 kg)   SpO2 99%   BMI 29.65 kg/m   Visual Acuity Right Eye Distance:   Left Eye Distance:   Bilateral Distance:    Right Eye Near:   Left Eye Near:    Bilateral Near:     Physical Exam  Constitutional: She is oriented to person, place, and time. She appears well-developed and well-nourished. No distress.  HENT:  Head: Normocephalic and atraumatic.  Eyes: EOM are normal.  Neck: Normal range of  motion.  Cardiovascular: Normal rate.   Pulmonary/Chest: Effort normal.  Musculoskeletal: Normal range of motion. She exhibits edema. She exhibits no tenderness.  Bilateral pedal edema. No tenderness to lower legs.    Neurological: She is alert and oriented to person, place, and time.  Skin: Skin is warm and dry. Capillary refill takes less than 2 seconds. Rash noted. She is not diaphoretic. There is erythema.  Bilateral lower legs: diffuse dried flaking skin. Right lower leg, lateral aspect: 2x3cm area of thick flaking dried skin.  Pink skin under the scab. No bleeding or pus. No induration or fluctuance.   Psychiatric: She has a normal mood and affect. Her behavior is normal.  Nursing note and vitals reviewed.    UC Treatments / Results  Labs (all labs ordered are listed, but only abnormal results are displayed) Labs Reviewed - No data to display  EKG  EKG Interpretation None       Radiology No results found.  Procedures Procedures (including critical care time)  Medications Ordered in UC Medications - No data to display   Initial Impression / Assessment and Plan / UC Course  I have reviewed the triage vital signs and the nursing notes.  Pertinent labs & imaging results that were available during my care of the patient were reviewed by me and considered in my medical decision making (see chart for details).     "scab" on leg does not appear to be a skin abscess or cellulitis.  Diffuse flaking skin more c/o psoriasis type skin dermatitis.  Will have pt try clobetasol cream and oral prednisone Encouraged f/u with PCP  Pt resource guide also provided. Will having nursing staff reach out to pt about helping set up transportation to medical appointments with various resources in the community.   Final Clinical Impressions(s) / UC Diagnoses   Final diagnoses:  Psoriasiform dermatitis    New Prescriptions Discharge Medication List as of 11/04/2016  2:06 PM    START  taking these medications   Details  clobetasol cream (TEMOVATE) 0.05 % Apply 1 application topically 2 (two) times daily., Starting Sun 11/04/2016, Normal    predniSONE (  DELTASONE) 20 MG tablet 3 tabs po day one, then 2 po daily x 4 days, Normal         Controlled Substance Prescriptions [place] Controlled Substance Registry consulted? Not Applicable   Rolla Plate 11/04/16 1544

## 2016-11-05 ENCOUNTER — Telehealth: Payer: Self-pay | Admitting: *Deleted

## 2016-11-05 MED ORDER — TRIAMCINOLONE ACETONIDE 0.5 % EX CREA: 1.0000 "application " | TOPICAL_CREAM | Freq: Two times a day (BID) | CUTANEOUS | 1 refills | Status: DC

## 2016-11-05 NOTE — Telephone Encounter (Signed)
Will switch to triamcinolone 0.5% cream.

## 2016-11-05 NOTE — Telephone Encounter (Signed)
Patient reports the clobetasol cream (TEMOVATE) 0.05 % was $95 at her pharmacy. She has silver script. She is inquiring about a change in RX. Please advise. She uses [facility] in [place].

## 2016-11-10 ENCOUNTER — Telehealth: Payer: Self-pay | Admitting: Family Medicine

## 2018-01-21 ENCOUNTER — Ambulatory Visit (INDEPENDENT_AMBULATORY_CARE_PROVIDER_SITE_OTHER): Payer: Medicare Other | Admitting: Sports Medicine

## 2018-01-21 ENCOUNTER — Encounter: Payer: Self-pay | Admitting: Sports Medicine

## 2018-01-21 ENCOUNTER — Ambulatory Visit (INDEPENDENT_AMBULATORY_CARE_PROVIDER_SITE_OTHER): Payer: Medicare Other

## 2018-01-21 VITALS — BP 135/81 | HR 84 | Ht 68.0 in | Wt 225.0 lb

## 2018-01-21 DIAGNOSIS — F1010 Alcohol abuse, uncomplicated: Secondary | ICD-10-CM | POA: Diagnosis not present

## 2018-01-21 DIAGNOSIS — Z1239 Encounter for other screening for malignant neoplasm of breast: Secondary | ICD-10-CM

## 2018-01-21 DIAGNOSIS — K7290 Hepatic failure, unspecified without coma: Secondary | ICD-10-CM

## 2018-01-21 DIAGNOSIS — R0989 Other specified symptoms and signs involving the circulatory and respiratory systems: Secondary | ICD-10-CM

## 2018-01-21 DIAGNOSIS — R600 Localized edema: Secondary | ICD-10-CM

## 2018-01-21 DIAGNOSIS — R0689 Other abnormalities of breathing: Secondary | ICD-10-CM | POA: Diagnosis not present

## 2018-01-21 DIAGNOSIS — E8779 Other fluid overload: Secondary | ICD-10-CM

## 2018-01-21 DIAGNOSIS — I890 Lymphedema, not elsewhere classified: Secondary | ICD-10-CM | POA: Insufficient documentation

## 2018-01-21 DIAGNOSIS — E538 Deficiency of other specified B group vitamins: Secondary | ICD-10-CM | POA: Diagnosis not present

## 2018-01-21 DIAGNOSIS — F321 Major depressive disorder, single episode, moderate: Secondary | ICD-10-CM | POA: Insufficient documentation

## 2018-01-21 DIAGNOSIS — D7589 Other specified diseases of blood and blood-forming organs: Secondary | ICD-10-CM

## 2018-01-21 DIAGNOSIS — Z0000 Encounter for general adult medical examination without abnormal findings: Secondary | ICD-10-CM

## 2018-01-21 MED ORDER — BUPROPION HCL ER (XL) 150 MG PO TB24: 150.0000 mg | ORAL_TABLET | ORAL | 3 refills | Status: DC

## 2018-01-21 MED ORDER — FUROSEMIDE 20 MG PO TABS
20.0000 mg | ORAL_TABLET | Freq: Every day | ORAL | 0 refills | Status: DC
Start: 2018-01-21 — End: 2018-03-18

## 2018-01-21 NOTE — Assessment & Plan Note (Addendum)
20 beers daily.   This may be contributing to the excessive leg swelling, weakness. She will work on cutting back her alcohol intake and we will treat this in future visits, we may use Campral. There is likely liver dysfunction, possible heart failure. Evaluating for all of this.

## 2018-01-21 NOTE — Progress Notes (Addendum)
Subjective:    CC: Leg swelling  HPI:  Amy Thornton returns, she has not been here in over 3 years.  Her life is taken somewhat of a downward spiral, she has been depressed, started drinking over 20 beers per day.  She has developed severe swelling in both of her legs, weakness.  She is depressed, not suicidal.  She is interested in being worked up for her leg swelling.  I reviewed the past medical history, family history, social history, surgical history, and allergies today and no changes were needed.  Please see the problem list section below in epic for further details.  Past Medical History: Past Medical History:  Diagnosis Date  . Anxiety   . Neuromuscular disorder (HCC)    pinched nerve leg  . Osteoarthritis    Past Surgical History: Past Surgical History:  Procedure Laterality Date  . BACK SURGERY     7 Lami's, 1 fusion  . CESAREAN SECTION    . JOINT REPLACEMENT    . orif Left    Tibia  . PARTIAL HIP ARTHROPLASTY     left x2  . TONSILLECTOMY    . TOTAL HIP ARTHROPLASTY Right 11/03/2012   Procedure: TOTAL HIP ARTHROPLASTY- right;  Surgeon: Nestor LewandowskyFrank J Rowan, MD;  Location: [facility] OR;  Service: Orthopedics;  Laterality: Right;   Social History: Social History   Socioeconomic History  . Marital status: Single    Spouse name: Not on file  . Number of children: Not on file  . Years of education: Not on file  . Highest education level: Not on file  Occupational History  . Not on file  Social Needs  . Financial resource strain: Not on file  . Food insecurity:    Worry: Not on file    Inability: Not on file  . Transportation needs:    Medical: Not on file    Non-medical: Not on file  Tobacco Use  . Smoking status: Current Every Day Smoker    Packs/day: 1.00    Years: 30.00    Pack years: 30.00    Types: Cigarettes  . Smokeless tobacco: Never Used  Substance and Sexual Activity  . Alcohol use: Yes    Alcohol/week: 100.0 standard drinks    Types: 100 Cans of beer per week    . Drug use: No  . Sexual activity: Yes    Birth control/protection: None, Post-menopausal  Lifestyle  . Physical activity:    Days per week: Not on file    Minutes per session: Not on file  . Stress: Not on file  Relationships  . Social connections:    Talks on phone: Not on file    Gets together: Not on file    Attends religious service: Not on file    Active member of club or organization: Not on file    Attends meetings of clubs or organizations: Not on file    Relationship status: Not on file  Other Topics Concern  . Not on file  Social History Narrative  . Not on file   Family History: Family History  Problem Relation Age of Onset  . Cancer Mother        breast  . Cancer Father        lung   Allergies: Allergies  Allergen Reactions  . Morphine And Related Hives and Itching  . Augmentin [Amoxicillin-Pot Clavulanate] Nausea And Vomiting   Medications: See med rec.  Review of Systems: No headache, visual changes, nausea, vomiting, diarrhea, constipation,  dizziness, abdominal pain, skin rash, fevers, chills, night sweats, swollen lymph nodes, weight loss, chest pain, body aches, joint swelling, muscle aches, shortness of breath, mood changes, visual or auditory hallucinations.  Objective:    General: Well Developed, appears morbidly obese, and in no acute distress.  Neuro: Alert and oriented x3, extra-ocular muscles intact, sensation grossly intact.  HEENT: Normocephalic, atraumatic, pupils equal round reactive to light, neck supple, no masses, no lymphadenopathy, thyroid nonpalpable.  Skin: Warm and dry, no rashes noted.  Cardiac: Regular rate and rhythm, no rubs or gallop, 2/6 systolic ejection murmur. Respiratory: Poor air movement in the right base. Not using accessory muscles, speaking in full sentences.  Abdominal: Soft, nontender, nondistended, positive bowel sounds, no masses, no organomegaly.  Extremities: Severe pitting edema in both legs with thick  keratinization, appears like elephantiasis.  Impression and Recommendations:    The patient was counselled, risk factors were discussed, anticipatory guidance given.  Depression, major, single episode, moderate (HCC) Adding Wellbutrin 150. Return monthly for a PHQ and gad.  Excessive drinking alcohol 20 beers daily.   This may be contributing to the excessive leg swelling, weakness. She will work on cutting back her alcohol intake and we will treat this in future visits, we may use Campral. There is likely liver dysfunction, possible heart failure. Evaluating for all of this.  Lymphedema of both lower extremities Evaluating for liver failure, heart failure, proteinuria. Would also like her to get lower extremity compression hose.  Annual physical exam Getting her caught up on screening tests.  Hepatic insufficiency (HCC) Low platelets, low globulin, elevated bilirubin all suggestive of liver cirrhosis.  Adding an abdominal ultrasound for structural diagnosis, also adding spironolactone 25 mg daily, referral to gastroenterology.  Macrocytosis Switching association for vitamin B12 to new diagnosis, will see if we can get it covered now.  I spent 45 minutes with this patient, greater than 50% was face-to-face time counseling regarding the above diagnoses ___________________________________________ Ihor Austin. Benjamin Stain, M.D., ABFM., CAQSM. Primary Care and Sports Medicine [facility]  Adjunct Professor of Family Medicine  [employer]

## 2018-01-21 NOTE — Assessment & Plan Note (Signed)
Adding Wellbutrin 150. Return monthly for a PHQ and gad.

## 2018-01-21 NOTE — Assessment & Plan Note (Addendum)
Evaluating for liver failure, heart failure, proteinuria. Would also like her to get lower extremity compression hose.

## 2018-01-21 NOTE — Assessment & Plan Note (Signed)
Getting her caught up on screening tests.

## 2018-01-22 DIAGNOSIS — D7589 Other specified diseases of blood and blood-forming organs: Secondary | ICD-10-CM | POA: Insufficient documentation

## 2018-01-22 MED ORDER — SPIRONOLACTONE 25 MG PO TABS: 25.0000 mg | ORAL_TABLET | Freq: Every day | ORAL | 3 refills | Status: DC

## 2018-01-22 NOTE — Addendum Note (Signed)
Addended by: Monica BectonHEKKEKANDAM, THOMAS J on: 01/22/2018 08:18 AM   Modules accepted: Orders

## 2018-01-22 NOTE — Assessment & Plan Note (Signed)
Low platelets, low globulin, elevated bilirubin all suggestive of liver cirrhosis.  Adding an abdominal ultrasound for structural diagnosis, also adding spironolactone 25 mg daily, referral to gastroenterology.

## 2018-01-22 NOTE — Assessment & Plan Note (Signed)
Switching association for vitamin B12 to new diagnosis, will see if we can get it covered now.

## 2018-01-23 ENCOUNTER — Ambulatory Visit (HOSPITAL_BASED_OUTPATIENT_CLINIC_OR_DEPARTMENT_OTHER): Payer: Medicare Other

## 2018-01-23 LAB — COMPREHENSIVE METABOLIC PANEL
AG Ratio: 0.8 (calc) — ABNORMAL LOW (ref 1.0–2.5)
ALT: 21 U/L (ref 6–29)
AST: 60 U/L — ABNORMAL HIGH (ref 10–35)
Albumin: 3.2 g/dL — ABNORMAL LOW (ref 3.6–5.1)
Alkaline phosphatase (APISO): 77 U/L (ref 33–130)
BUN/Creatinine Ratio: 8 (calc) (ref 6–22)
BUN: 3 mg/dL — ABNORMAL LOW (ref 7–25)
CO2: 26 mmol/L (ref 20–32)
Calcium: 8.7 mg/dL (ref 8.6–10.4)
Chloride: 103 mmol/L (ref 98–110)
Creat: 0.38 mg/dL — ABNORMAL LOW (ref 0.50–1.05)
Globulin: 4 g/dL (calc) — ABNORMAL HIGH (ref 1.9–3.7)
Glucose, Bld: 92 mg/dL (ref 65–139)
Potassium: 4.3 mmol/L (ref 3.5–5.3)
Sodium: 138 mmol/L (ref 135–146)
Total Bilirubin: 1.7 mg/dL — ABNORMAL HIGH (ref 0.2–1.2)
Total Protein: 7.2 g/dL (ref 6.1–8.1)

## 2018-01-23 LAB — TEST AUTHORIZATION

## 2018-01-23 LAB — CBC
HCT: 40 % (ref 35.0–45.0)
Hemoglobin: 13.9 g/dL (ref 11.7–15.5)
MCH: 35.4 pg — ABNORMAL HIGH (ref 27.0–33.0)
MCHC: 34.8 g/dL (ref 32.0–36.0)
MCV: 101.8 fL — ABNORMAL HIGH (ref 80.0–100.0)
MPV: 11.3 fL (ref 7.5–12.5)
Platelets: 90 10*3/uL — ABNORMAL LOW (ref 140–400)
RBC: 3.93 10*6/uL (ref 3.80–5.10)
RDW: 11.4 % (ref 11.0–15.0)
WBC: 4 10*3/uL (ref 3.8–10.8)

## 2018-01-23 LAB — ABN TEST REFUSAL: ABN TEST REFUSED: 89992714852373900000

## 2018-01-23 LAB — HEPATITIS C ANTIBODY
Hepatitis C Ab: NONREACTIVE
SIGNAL TO CUT-OFF: 0.07 (ref <1.00)

## 2018-01-23 LAB — HIV ANTIBODY (ROUTINE TESTING W REFLEX): HIV 1&2 Ab, 4th Generation: NONREACTIVE

## 2018-01-23 LAB — VITAMIN B12: Vitamin B-12: 1023 pg/mL (ref 200–1100)

## 2018-01-28 ENCOUNTER — Telehealth: Payer: Self-pay | Admitting: *Deleted

## 2018-01-28 DIAGNOSIS — Z1211 Encounter for screening for malignant neoplasm of colon: Secondary | ICD-10-CM

## 2018-01-28 NOTE — Telephone Encounter (Signed)
Done

## 2018-01-28 NOTE — Telephone Encounter (Signed)
Pt left vm stating that Medicare informed her that you used the wrong diagnosis code when ordered.  She said they prefer z12.11 or z12.12. she would like for you to reorder it with one of those codes please.

## 2018-01-28 NOTE — Telephone Encounter (Signed)
Pt.notified

## 2018-02-06 DIAGNOSIS — Z1211 Encounter for screening for malignant neoplasm of colon: Secondary | ICD-10-CM | POA: Diagnosis not present

## 2018-02-10 ENCOUNTER — Ambulatory Visit (INDEPENDENT_AMBULATORY_CARE_PROVIDER_SITE_OTHER): Payer: Medicare Other | Admitting: Sports Medicine

## 2018-02-10 ENCOUNTER — Encounter: Payer: Self-pay | Admitting: Sports Medicine

## 2018-02-10 DIAGNOSIS — K7290 Hepatic failure, unspecified without coma: Secondary | ICD-10-CM | POA: Diagnosis not present

## 2018-02-10 DIAGNOSIS — F321 Major depressive disorder, single episode, moderate: Secondary | ICD-10-CM

## 2018-02-10 DIAGNOSIS — I890 Lymphedema, not elsewhere classified: Secondary | ICD-10-CM | POA: Diagnosis not present

## 2018-02-10 LAB — COLOGUARD: Cologuard: NEGATIVE

## 2018-02-10 MED ORDER — SPIRONOLACTONE 50 MG PO TABS: 50.0000 mg | ORAL_TABLET | Freq: Every day | ORAL | 3 refills | Status: DC

## 2018-02-10 MED ORDER — BUPROPION HCL ER (XL) 300 MG PO TB24: 300.0000 mg | ORAL_TABLET | ORAL | 3 refills | Status: DC

## 2018-02-10 NOTE — Assessment & Plan Note (Signed)
Thrombocytopenia, low globulin, elevated bilirubin all suggestive of liver cirrhosis. Still awaiting abdominal ultrasound which she promises to do. Awaiting her visit with gastroenterology. Doubling Spironolactone to 50 mg daily.

## 2018-02-10 NOTE — Progress Notes (Signed)
Subjective:    CC: Follow-up  HPI: Hepatic insufficiency: Has not yet seen gastroenterology or had her ultrasound.  She tells me she will cut back her drinking after the new year.  We did advise her not to stop all of a sudden.  Lymphedema: Did not schedule with lymphedema physical therapy because she thought her home health PT was the same.  Depression: No suicidal or homicidal ideation, tells me that Wellbutrin 150 is not doing anything but her scores have improved considerably.  I reviewed the past medical history, family history, social history, surgical history, and allergies today and no changes were needed.  Please see the problem list section below in epic for further details.  Past Medical History: Past Medical History:  Diagnosis Date  . Anxiety   . Neuromuscular disorder (HCC)    pinched nerve leg  . Osteoarthritis    Past Surgical History: Past Surgical History:  Procedure Laterality Date  . BACK SURGERY     7 Lami's, 1 fusion  . CESAREAN SECTION    . JOINT REPLACEMENT    . orif Left    Tibia  . PARTIAL HIP ARTHROPLASTY     left x2  . TONSILLECTOMY    . TOTAL HIP ARTHROPLASTY Right 11/03/2012   Procedure: TOTAL HIP ARTHROPLASTY- right;  Surgeon: Nestor LewandowskyFrank J Rowan, MD;  Location: [facility] OR;  Service: Orthopedics;  Laterality: Right;   Social History: Social History   Socioeconomic History  . Marital status: Single    Spouse name: Not on file  . Number of children: Not on file  . Years of education: Not on file  . Highest education level: Not on file  Occupational History  . Not on file  Social Needs  . Financial resource strain: Not on file  . Food insecurity:    Worry: Not on file    Inability: Not on file  . Transportation needs:    Medical: Not on file    Non-medical: Not on file  Tobacco Use  . Smoking status: Current Every Day Smoker    Packs/day: 1.00    Years: 30.00    Pack years: 30.00    Types: Cigarettes  . Smokeless tobacco: Never Used    Substance and Sexual Activity  . Alcohol use: Yes    Alcohol/week: 100.0 standard drinks    Types: 100 Cans of beer per week  . Drug use: No  . Sexual activity: Yes    Birth control/protection: None, Post-menopausal  Lifestyle  . Physical activity:    Days per week: Not on file    Minutes per session: Not on file  . Stress: Not on file  Relationships  . Social connections:    Talks on phone: Not on file    Gets together: Not on file    Attends religious service: Not on file    Active member of club or organization: Not on file    Attends meetings of clubs or organizations: Not on file    Relationship status: Not on file  Other Topics Concern  . Not on file  Social History Narrative  . Not on file   Family History: Family History  Problem Relation Age of Onset  . Cancer Mother        breast  . Cancer Father        lung   Allergies: Allergies  Allergen Reactions  . Morphine And Related Hives and Itching  . Augmentin [Amoxicillin-Pot Clavulanate] Nausea And Vomiting   Medications: See  med rec.  Review of Systems: No fevers, chills, night sweats, weight loss, chest pain, or shortness of breath.   Objective:    General: Well Developed, well nourished, and in no acute distress.  Neuro: Alert and oriented x3, extra-ocular muscles intact, sensation grossly intact.  HEENT: Normocephalic, atraumatic, pupils equal round reactive to light, neck supple, no masses, no lymphadenopathy, thyroid nonpalpable.  Skin: Warm and dry, no rashes. Cardiac: Regular rate and rhythm, no murmurs rubs or gallops, severe lower extremity lymphedema bilateral and symmetric.  Slightly better than at the previous visit. Respiratory: Clear to auscultation bilaterally. Not using accessory muscles, speaking in full sentences.  Impression and Recommendations:    Depression, major, single episode, moderate (HCC) Increasing Wellbutrin to 300.   Hepatic insufficiency (HCC) Thrombocytopenia, low  globulin, elevated bilirubin all suggestive of liver cirrhosis. Still awaiting abdominal ultrasound which she promises to do. Awaiting her visit with gastroenterology. Doubling Spironolactone to 50 mg daily.   Lymphedema of both lower extremities Nearing elephantiasis, likely secondary to hepatic failure. She declined lymphedema therapy.   She thought that her home health physical therapist was the same thing. ___________________________________________ Ihor Austinhomas J. Benjamin Stainhekkekandam, M.D., ABFM., CAQSM. Primary Care and Sports Medicine [facility]  Adjunct Professor of Family Medicine  [employer]

## 2018-02-10 NOTE — Assessment & Plan Note (Signed)
Increasing Wellbutrin to 300.

## 2018-02-10 NOTE — Assessment & Plan Note (Signed)
Nearing elephantiasis, likely secondary to hepatic failure. She declined lymphedema therapy.   She thought that her home health physical therapist was the same thing.

## 2018-03-10 ENCOUNTER — Encounter: Payer: Self-pay | Admitting: Sports Medicine

## 2018-03-10 ENCOUNTER — Ambulatory Visit (INDEPENDENT_AMBULATORY_CARE_PROVIDER_SITE_OTHER): Payer: Medicare Other | Admitting: Sports Medicine

## 2018-03-10 DIAGNOSIS — F1010 Alcohol abuse, uncomplicated: Secondary | ICD-10-CM | POA: Diagnosis not present

## 2018-03-10 DIAGNOSIS — I890 Lymphedema, not elsewhere classified: Secondary | ICD-10-CM | POA: Diagnosis not present

## 2018-03-10 DIAGNOSIS — K7290 Hepatic failure, unspecified without coma: Secondary | ICD-10-CM | POA: Diagnosis not present

## 2018-03-10 DIAGNOSIS — F321 Major depressive disorder, single episode, moderate: Secondary | ICD-10-CM

## 2018-03-10 NOTE — Assessment & Plan Note (Addendum)
Likely secondary to hepatic failure, volume overload from excessive beer consumption. Agreeable now to do home health lymphedema physical therapy. If insufficient improvement at the follow-up visit we will likely start Unna boot treatment.

## 2018-03-10 NOTE — Progress Notes (Signed)
Subjective:    CC: Follow-up  HPI: Amy Thornton returns for follow-up of multiple issues, she has not obtained her liver ultrasound, she understands the risk.  She has not seen her liver doctor.  She has cut back on her alcohol use significantly.  Lower extremity lymphedema has improved considerably with 50 mg of Spironolactone.  She has not yet gotten lymphedema physical therapy.  She also reports that her depression has resolved with a decrease in her beer intake.  She has self discontinued her Wellbutrin.  No suicidal or homicidal ideation.  I reviewed the past medical history, family history, social history, surgical history, and allergies today and no changes were needed.  Please see the problem list section below in epic for further details.  Past Medical History: Past Medical History:  Diagnosis Date  . Anxiety   . Neuromuscular disorder (HCC)    pinched nerve leg  . Osteoarthritis    Past Surgical History: Past Surgical History:  Procedure Laterality Date  . BACK SURGERY     7 Lami's, 1 fusion  . CESAREAN SECTION    . JOINT REPLACEMENT    . orif Left    Tibia  . PARTIAL HIP ARTHROPLASTY     left x2  . TONSILLECTOMY    . TOTAL HIP ARTHROPLASTY Right 11/03/2012   Procedure: TOTAL HIP ARTHROPLASTY- right;  Surgeon: [name], MD;  Location: [facility] OR;  Service: Orthopedics;  Laterality: Right;   Social History: Social History   Socioeconomic History  . Marital status: Single    Spouse name: Not on file  . Number of children: Not on file  . Years of education: Not on file  . Highest education level: Not on file  Occupational History  . Not on file  Social Needs  . Financial resource strain: Not on file  . Food insecurity:    Worry: Not on file    Inability: Not on file  . Transportation needs:    Medical: Not on file    Non-medical: Not on file  Tobacco Use  . Smoking status: Current Every Day Smoker    Packs/day: 1.00    Years: 30.00    Pack years: 30.00   Types: Cigarettes  . Smokeless tobacco: Never Used  Substance and Sexual Activity  . Alcohol use: Yes    Alcohol/week: 100.0 standard drinks    Types: 100 Cans of beer per week  . Drug use: No  . Sexual activity: Yes    Birth control/protection: None, Post-menopausal  Lifestyle  . Physical activity:    Days per week: Not on file    Minutes per session: Not on file  . Stress: Not on file  Relationships  . Social connections:    Talks on phone: Not on file    Gets together: Not on file    Attends religious service: Not on file    Active member of club or organization: Not on file    Attends meetings of clubs or organizations: Not on file    Relationship status: Not on file  Other Topics Concern  . Not on file  Social History Narrative  . Not on file   Family History: Family History  Problem Relation Age of Onset  . Cancer Mother        breast  . Cancer Father        lung   Allergies: Allergies  Allergen Reactions  . Morphine And Related Hives and Itching  . Augmentin [Amoxicillin-Pot Clavulanate] Nausea And  Vomiting   Medications: See med rec.  Review of Systems: No fevers, chills, night sweats, weight loss, chest pain, or shortness of breath.   Objective:    General: Well Developed, well nourished, and in no acute distress.  Neuro: Alert and oriented x3, extra-ocular muscles intact, sensation grossly intact.  HEENT: Normocephalic, atraumatic, pupils equal round reactive to light, neck supple, no masses, no lymphadenopathy, thyroid nonpalpable.  Skin: Warm and dry, no rashes. Cardiac: Regular rate and rhythm, no murmurs rubs or gallops, 3+ bilateral lower extremity pitting edema with hyperkeratinization of the skin on the anterior shins.  Respiratory: Clear to auscultation bilaterally. Not using accessory muscles, speaking in full sentences.  Impression and Recommendations:    Excessive drinking alcohol Has improved considerably, some days drinks 0  beer.  Lymphedema of both lower extremities Likely secondary to hepatic failure, volume overload from excessive beer consumption. Agreeable now to do home health lymphedema physical therapy. If insufficient improvement at the follow-up visit we will likely start Unna boot treatment.  Depression, major, single episode, moderate (HCC) Clinically resolved, patient has discontinued all medications. This is improved considerably with her decreasing consumption of alcohol.  Hepatic insufficiency (HCC) Noted thrombocytopenia, low globulin, elevated bilirubin all suggestive of liver cirrhosis. Continues to agree to do her abdominal ultrasound. Has not yet seen the hepatologist. I doubled spironolactone at the last visit and she continues to do well. Patient would like to hold off on labs for now. ___________________________________________ Ihor Austin. Benjamin Stain, M.D., ABFM., CAQSM. Primary Care and Sports Medicine [facility]  Adjunct Professor of Family Medicine  [employer]

## 2018-03-10 NOTE — Assessment & Plan Note (Signed)
Has improved considerably, some days drinks 0 beer.

## 2018-03-10 NOTE — Assessment & Plan Note (Signed)
Clinically resolved, patient has discontinued all medications. This is improved considerably with her decreasing consumption of alcohol.

## 2018-03-10 NOTE — Assessment & Plan Note (Signed)
Noted thrombocytopenia, low globulin, elevated bilirubin all suggestive of liver cirrhosis. Continues to agree to do her abdominal ultrasound. Has not yet seen the hepatologist. I doubled spironolactone at the last visit and she continues to do well. Patient would like to hold off on labs for now.

## 2018-03-12 ENCOUNTER — Telehealth: Payer: Self-pay | Admitting: Sports Medicine

## 2018-03-12 NOTE — Telephone Encounter (Signed)
Dr. Karie Schwalbe   I received a call from [facility] stating patient is refusing service she wants to wait to schedule until after her next ortho appointment. After reviewing her chart the only thing I could figure out is she wants to wait until she sees you again in April. Kathlene November is going to try and call to speak to patient tomorrow and see if he can get her to schedule. I just wanted to let you know where we were with the referral - CF

## 2018-03-18 ENCOUNTER — Other Ambulatory Visit: Payer: Self-pay | Admitting: Sports Medicine

## 2018-03-18 DIAGNOSIS — I890 Lymphedema, not elsewhere classified: Secondary | ICD-10-CM

## 2018-03-18 MED ORDER — FUROSEMIDE 20 MG PO TABS: 20.0000 mg | ORAL_TABLET | Freq: Every day | ORAL | 0 refills | Status: DC

## 2018-04-02 DIAGNOSIS — F411 Generalized anxiety disorder: Secondary | ICD-10-CM | POA: Diagnosis not present

## 2018-04-03 ENCOUNTER — Encounter: Payer: Self-pay | Admitting: Sports Medicine

## 2018-04-17 ENCOUNTER — Other Ambulatory Visit: Payer: Self-pay | Admitting: Sports Medicine

## 2018-04-17 DIAGNOSIS — I890 Lymphedema, not elsewhere classified: Secondary | ICD-10-CM

## 2018-04-17 MED ORDER — FUROSEMIDE 20 MG PO TABS: 20.0000 mg | ORAL_TABLET | Freq: Every day | ORAL | 0 refills | Status: DC

## 2018-05-26 ENCOUNTER — Other Ambulatory Visit: Payer: Self-pay | Admitting: Sports Medicine

## 2018-05-26 DIAGNOSIS — I890 Lymphedema, not elsewhere classified: Secondary | ICD-10-CM

## 2018-05-26 MED ORDER — FUROSEMIDE 20 MG PO TABS: 20.0000 mg | ORAL_TABLET | Freq: Every day | ORAL | 3 refills | Status: DC

## 2018-06-09 ENCOUNTER — Ambulatory Visit: Payer: Medicare Other | Admitting: Sports Medicine

## 2018-09-28 DIAGNOSIS — Z885 Allergy status to narcotic agent status: Secondary | ICD-10-CM | POA: Diagnosis not present

## 2018-09-28 DIAGNOSIS — R58 Hemorrhage, not elsewhere classified: Secondary | ICD-10-CM | POA: Diagnosis not present

## 2018-09-28 DIAGNOSIS — R040 Epistaxis: Secondary | ICD-10-CM | POA: Diagnosis not present

## 2018-09-28 DIAGNOSIS — Z880 Allergy status to penicillin: Secondary | ICD-10-CM | POA: Diagnosis not present

## 2018-09-28 DIAGNOSIS — R030 Elevated blood-pressure reading, without diagnosis of hypertension: Secondary | ICD-10-CM | POA: Diagnosis not present

## 2018-09-28 DIAGNOSIS — M1990 Unspecified osteoarthritis, unspecified site: Secondary | ICD-10-CM | POA: Diagnosis not present

## 2018-09-28 DIAGNOSIS — Z79899 Other long term (current) drug therapy: Secondary | ICD-10-CM | POA: Diagnosis not present

## 2018-10-02 DIAGNOSIS — R040 Epistaxis: Secondary | ICD-10-CM | POA: Diagnosis not present

## 2018-10-23 ENCOUNTER — Other Ambulatory Visit: Payer: Self-pay | Admitting: Sports Medicine

## 2018-10-23 DIAGNOSIS — I890 Lymphedema, not elsewhere classified: Secondary | ICD-10-CM

## 2019-01-02 ENCOUNTER — Telehealth: Payer: Self-pay | Admitting: Sports Medicine

## 2019-01-02 NOTE — Telephone Encounter (Signed)
EditSantiago Glad, RN (Tahja's nurse) number: [phone]

## 2019-01-02 NOTE — Telephone Encounter (Signed)
Patient's nurse Santiago Glad, RN) called about [name] having irritated skin on the right lower extremity near the ankle, they believe it is possible fungus so they won't be wrapping her legs. They are concerned about what may happen if they don't wrap the legs. She will have an appointment with you next week, they just want to make sure that not wrapping the legs won't be detrimental.

## 2019-01-02 NOTE — Telephone Encounter (Signed)
It will not be detrimental, they can continue to wrap it, I can simply unwrap the leg during her visit and place another Unna boot.

## 2019-01-06 ENCOUNTER — Ambulatory Visit (INDEPENDENT_AMBULATORY_CARE_PROVIDER_SITE_OTHER): Payer: Medicare Other | Admitting: Sports Medicine

## 2019-01-06 ENCOUNTER — Other Ambulatory Visit: Payer: Self-pay

## 2019-01-06 ENCOUNTER — Encounter: Payer: Self-pay | Admitting: Sports Medicine

## 2019-01-06 DIAGNOSIS — I890 Lymphedema, not elsewhere classified: Secondary | ICD-10-CM

## 2019-01-06 DIAGNOSIS — K7290 Hepatic failure, unspecified without coma: Secondary | ICD-10-CM

## 2019-01-06 DIAGNOSIS — F1010 Alcohol abuse, uncomplicated: Secondary | ICD-10-CM

## 2019-01-06 DIAGNOSIS — F321 Major depressive disorder, single episode, moderate: Secondary | ICD-10-CM | POA: Diagnosis not present

## 2019-01-06 DIAGNOSIS — Z96641 Presence of right artificial hip joint: Secondary | ICD-10-CM | POA: Diagnosis not present

## 2019-01-06 DIAGNOSIS — Z0000 Encounter for general adult medical examination without abnormal findings: Secondary | ICD-10-CM

## 2019-01-06 NOTE — Progress Notes (Signed)
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  At this point she really does not know what she wants.  I reviewed the past medical history, family history, social history, surgical history, and allergies today and no changes were needed.  Please see the problem list section below in epic for further details.  Past Medical History: Past Medical History:  Diagnosis Date  . Anxiety   . Neuromuscular disorder (HCC)    pinched nerve leg  . Osteoarthritis    Past Surgical History: Past Surgical History:  Procedure Laterality Date  . BACK SURGERY     7 Lami's, 1 fusion  . CESAREAN SECTION    . JOINT REPLACEMENT    . orif Left    [place]  . PARTIAL HIP ARTHROPLASTY     left x2  . TONSILLECTOMY    . TOTAL HIP ARTHROPLASTY Right 11/03/2012   Procedure: TOTAL HIP ARTHROPLASTY- right;  Surgeon: Kerin Salen, MD;  Location: [facility];  Service: Orthopedics;  Laterality: Right;   Social History: Social History   Socioeconomic History  . Marital status: Single    Spouse name: Not on file  . Number of children: Not on file  . Years of education: Not on file  . Highest education level: Not on file  Occupational History  . Not on file  Social Needs  . Financial resource strain: Not on file  . Food insecurity    Worry: Not on file    Inability: Not on file  .  Transportation needs    Medical: Not on file    Non-medical: Not on file  Tobacco Use  . Smoking status: Current Every Day Smoker    Packs/day: 1.00    Years: 30.00    Pack years: 30.00    Types: Cigarettes  . Smokeless tobacco: Never Used  Substance and Sexual Activity  . Alcohol use: Yes    Alcohol/week: 100.0 standard drinks    Types: 100 Cans of beer per week  . Drug use: No  . Sexual activity: Yes    Birth control/protection: None, Post-menopausal  Lifestyle  . Physical activity    Days per week: Not on file    Minutes per session: Not on file  . Stress: Not on file  Relationships  . Social Herbalist on phone: Not on file    Gets together: Not on file    Attends religious service: Not on file    Active member of club or organization: Not on file    Attends meetings of clubs or organizations: Not on file    Relationship status: Not on file  Other Topics Concern  . Not on file  Social History Narrative  . Not on file   Family History: Family History  Problem Relation Age of Onset  . Cancer Mother  breast  . Cancer Father        lung   Allergies: Allergies  Allergen Reactions  . Morphine And Related Hives and Itching  . Augmentin [Amoxicillin-Pot Clavulanate] Nausea And Vomiting   Medications: See med rec.  Review of Systems: No fevers, chills, night sweats, weight loss, chest pain, or shortness of breath.   Objective:    General: Well Developed, well nourished, and in no acute distress.  Neuro: Alert and oriented x3, extra-ocular muscles intact, sensation grossly intact.  HEENT: Normocephalic, atraumatic, pupils equal round reactive to light, neck supple, no masses, no lymphadenopathy, thyroid nonpalpable.  Skin: Warm and dry, no rashes. Cardiac: Regular rate and rhythm, no murmurs rubs or gallops, no lower extremity edema.  Respiratory: Clear to auscultation bilaterally. Not using accessory muscles, speaking in full sentences. Lower  extremities: 3+ pitting edema, bilateral venous stasis dermatitis with some scaling on the right.  Impression and Recommendations:    Excessive drinking alcohol Continues to drink.  Depression, major, single episode, moderate (HCC) Tearful and depressed but declines taking antidepressants or doing therapy.  Hepatic insufficiency (HCC) Starting to get epistaxis, she likely has thrombocytopenia, portal hypertension and cirrhosis. She refuses labs, she refuses liver ultrasound, risks, benefits, alternatives offered.  History of total right hip arthroplasty She was doing well post arthroplasty, I would recommend 2 g of amoxicillin prior to invasive dental procedures.  Lymphedema of both lower extremities This is likely secondary to volume overload from excessive beer consumption, hepatic failure/cirrhosis. Her home health nurses stopped doing the Unna boots due to a rash, I think this is simply hyperkeratosis/scaly skin, it does not look like a fungal infection, she certainly has bilateral venous stasis dermatitis. She stopped her spironolactone, and declines getting her labs checked including electrolytes and renal function, I did advise that we would not be doing furosemide without being able to check her renal function and potassium. She did have some stomach upset with spironolactone, if she does agree at some point to get her hepatic ultrasound, labs, we could try eplerenone instead. Patient understands the risks.  Annual physical exam Declines preventive measures such as mammogram, vaccines.   ___________________________________________ Ihor Austin. Benjamin Stain, M.D., ABFM., CAQSM. Primary Care and Sports Medicine [facility]  Adjunct Professor of Family Medicine  [employer]

## 2019-01-06 NOTE — Assessment & Plan Note (Signed)
Starting to get epistaxis, she likely has thrombocytopenia, portal hypertension and cirrhosis. She refuses labs, she refuses liver ultrasound, risks, benefits, alternatives offered.

## 2019-01-06 NOTE — Assessment & Plan Note (Signed)
Tearful and depressed but declines taking antidepressants or doing therapy.

## 2019-01-06 NOTE — Assessment & Plan Note (Signed)
She was doing well post arthroplasty, I would recommend 2 g of amoxicillin prior to invasive dental procedures.

## 2019-01-06 NOTE — Assessment & Plan Note (Signed)
This is likely secondary to volume overload from excessive beer consumption, hepatic failure/cirrhosis. Her home health nurses stopped doing the Unna boots due to a rash, I think this is simply hyperkeratosis/scaly skin, it does not look like a fungal infection, she certainly has bilateral venous stasis dermatitis. She stopped her spironolactone, and declines getting her labs checked including electrolytes and renal function, I did advise that we would not be doing furosemide without being able to check her renal function and potassium. She did have some stomach upset with spironolactone, if she does agree at some point to get her hepatic ultrasound, labs, we could try eplerenone instead. Patient understands the risks.

## 2019-01-06 NOTE — Assessment & Plan Note (Signed)
Continues to drink

## 2019-01-06 NOTE — Assessment & Plan Note (Signed)
Declines preventive measures such as mammogram, vaccines.

## 2019-02-15 IMAGING — DX DG CHEST 2V
2 series · 2 of 2 positions shown · non-contrast
Comparison: April 09, 2014

CLINICAL DATA: Lower extremity edema. Abnormal breath sounds on
right.

EXAM:
CHEST - 2 VIEW

[chest pa]
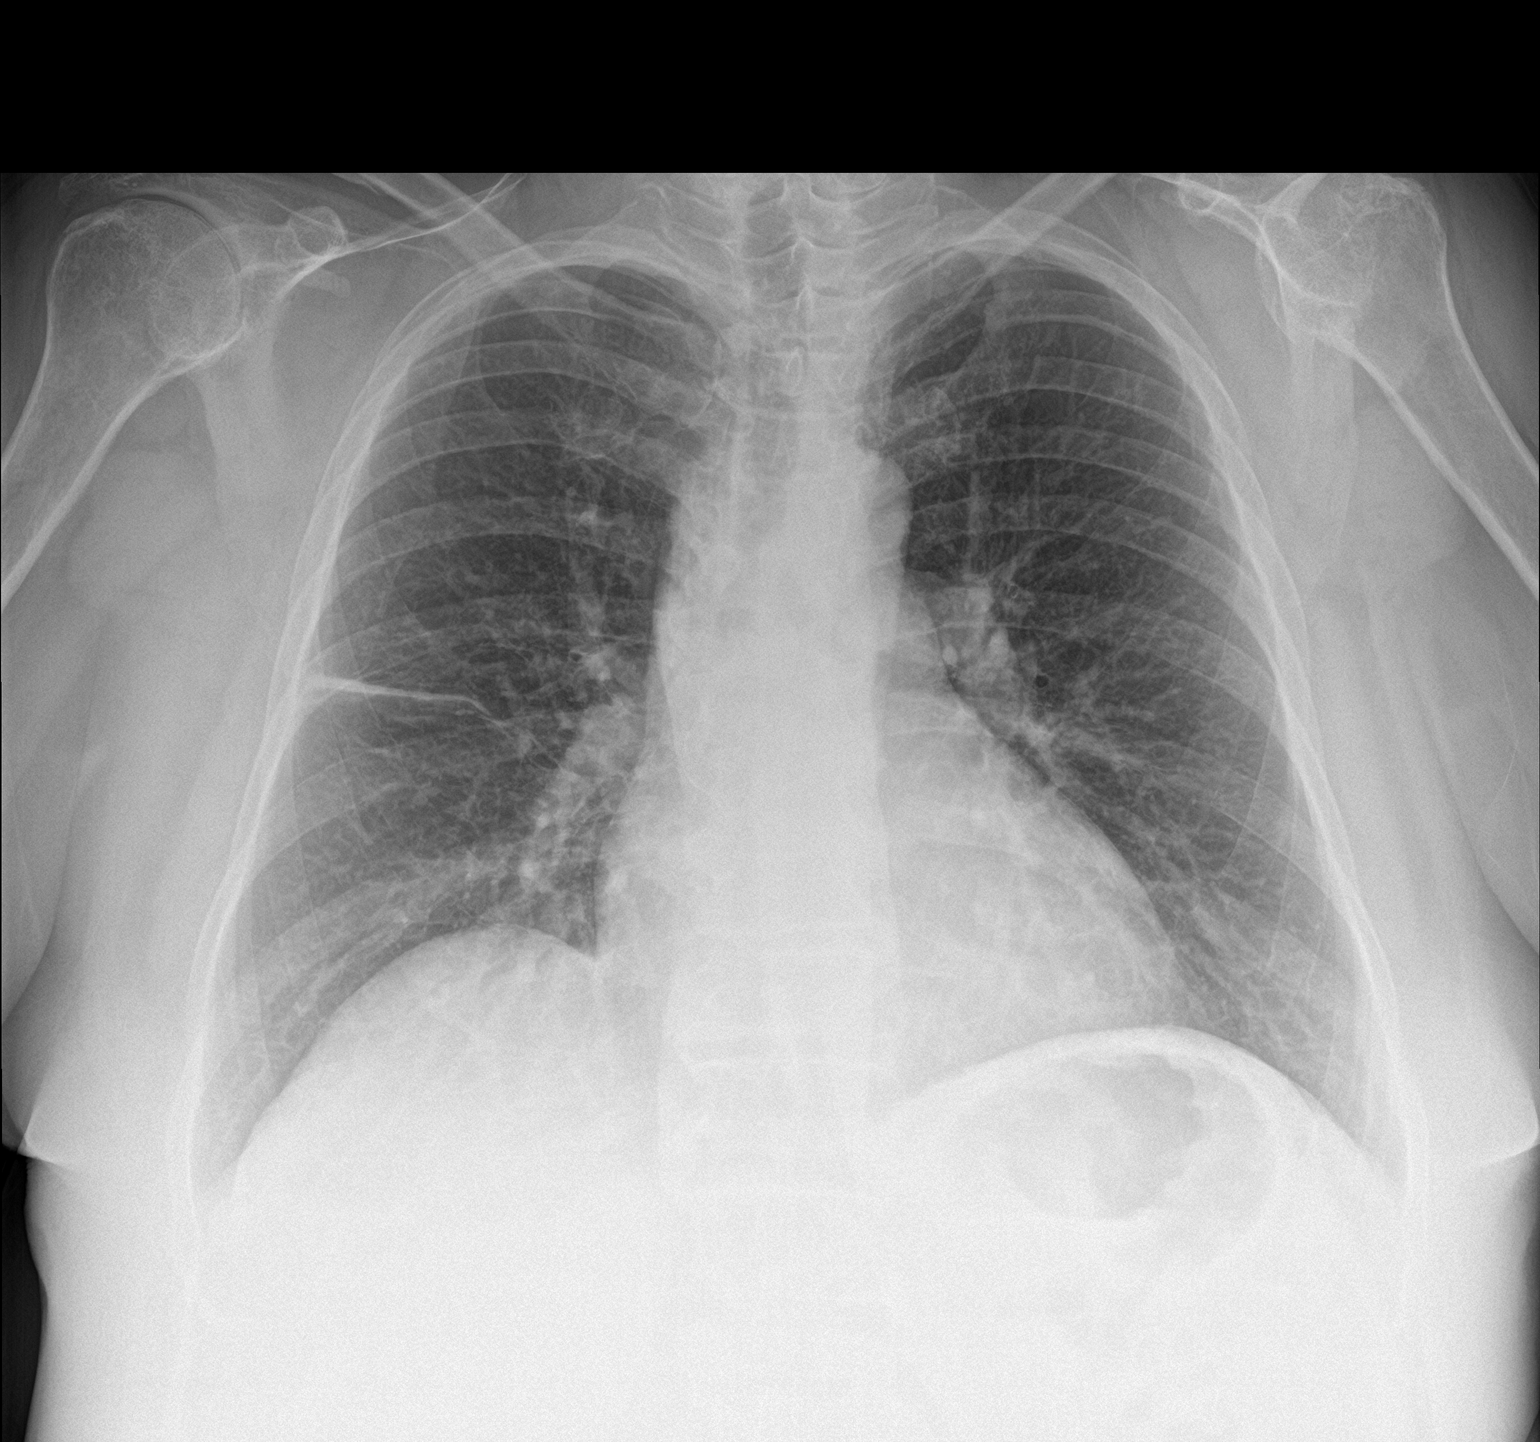

[chest lat]
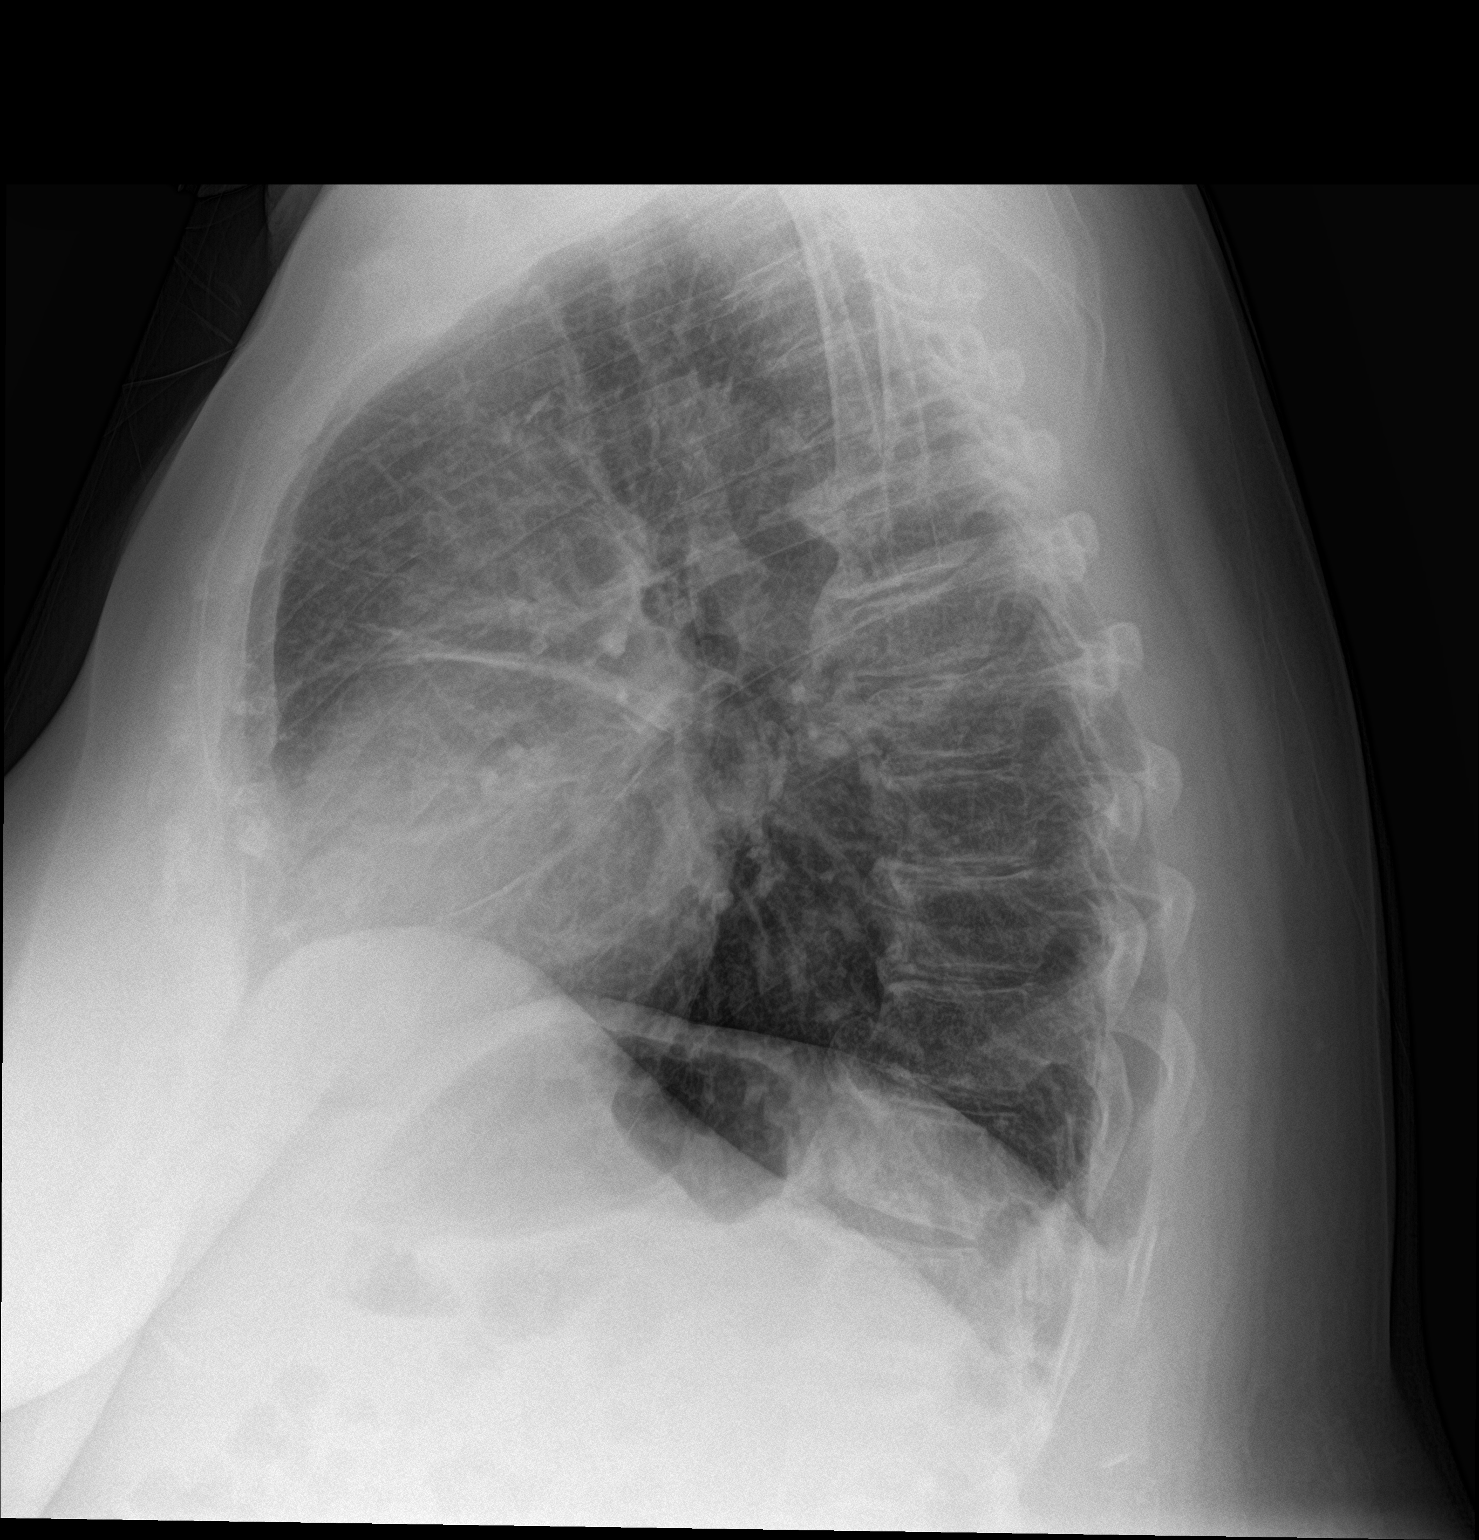

[2 of 2 positions shown; findings below may reference images not displayed]

FINDINGS: There is atelectatic change in the superior aspect of the right
middle lobe. The lungs elsewhere are clear. Heart size and pulmonary
vascularity are normal. No adenopathy. There is degenerative change
in the midthoracic spine.
IMPRESSION: Atelectatic change in the superior aspect of the right middle lobe.
No edema or consolidation. Stable cardiac silhouette.

## 2019-03-13 DIAGNOSIS — I890 Lymphedema, not elsewhere classified: Secondary | ICD-10-CM | POA: Diagnosis not present

## 2019-05-11 ENCOUNTER — Telehealth: Payer: Self-pay

## 2019-05-11 NOTE — Telephone Encounter (Signed)
Verbal orders given for Readywrap compression.

## 2019-05-11 NOTE — Telephone Encounter (Signed)
Verbal order given.  They can fax me the written order to sign.

## 2019-05-11 NOTE — Telephone Encounter (Signed)
Amy Thornton with [facility] called wanting an order for a Readywrap compression. Please advise.

## 2019-06-11 DIAGNOSIS — I890 Lymphedema, not elsewhere classified: Secondary | ICD-10-CM | POA: Diagnosis not present

## 2019-08-28 DIAGNOSIS — Z23 Encounter for immunization: Secondary | ICD-10-CM | POA: Diagnosis not present

## 2019-09-19 DIAGNOSIS — Z23 Encounter for immunization: Secondary | ICD-10-CM | POA: Diagnosis not present

## 2020-02-22 ENCOUNTER — Telehealth: Payer: Self-pay | Admitting: Sports Medicine

## 2020-02-22 NOTE — Telephone Encounter (Signed)
I have not seen this patient since 2020 and I need to determine why we are using catheterization.

## 2020-02-22 NOTE — Telephone Encounter (Signed)
Husband aware there is nothing we can do until we can see the patient and assess her needs. They are afraid to come out due to COVID. They do not have Mychart access either.

## 2020-02-22 NOTE — Telephone Encounter (Signed)
Dr. Karie Schwalbe   Patient's husband called and requested a referral for [facility] to place a Catheter so wife can use bathroom while he works. He states he had her swelling down in her legs and she was doing really well but she had a fall and twisted the muscle and now her leg is swelled pretty good and it is hard for her to get up and dow to go to the bathroom and he must return to work. Please advise. - CF

## 2020-03-02 DIAGNOSIS — Z7401 Bed confinement status: Secondary | ICD-10-CM | POA: Diagnosis not present

## 2020-03-02 DIAGNOSIS — W050XXA Fall from non-moving wheelchair, initial encounter: Secondary | ICD-10-CM | POA: Diagnosis not present

## 2020-03-02 DIAGNOSIS — S329XXA Fracture of unspecified parts of lumbosacral spine and pelvis, initial encounter for closed fracture: Secondary | ICD-10-CM | POA: Diagnosis not present

## 2020-03-02 DIAGNOSIS — Z9181 History of falling: Secondary | ICD-10-CM | POA: Diagnosis not present

## 2020-03-02 DIAGNOSIS — Z79899 Other long term (current) drug therapy: Secondary | ICD-10-CM | POA: Diagnosis not present

## 2020-03-02 DIAGNOSIS — W19XXXA Unspecified fall, initial encounter: Secondary | ICD-10-CM | POA: Diagnosis not present

## 2020-03-02 DIAGNOSIS — M25552 Pain in left hip: Secondary | ICD-10-CM | POA: Diagnosis not present

## 2020-03-02 DIAGNOSIS — R262 Difficulty in walking, not elsewhere classified: Secondary | ICD-10-CM | POA: Diagnosis not present

## 2020-03-02 DIAGNOSIS — E8809 Other disorders of plasma-protein metabolism, not elsewhere classified: Secondary | ICD-10-CM | POA: Diagnosis present

## 2020-03-02 DIAGNOSIS — S32592D Other specified fracture of left pubis, subsequent encounter for fracture with routine healing: Secondary | ICD-10-CM | POA: Diagnosis not present

## 2020-03-02 DIAGNOSIS — F17200 Nicotine dependence, unspecified, uncomplicated: Secondary | ICD-10-CM | POA: Diagnosis not present

## 2020-03-02 DIAGNOSIS — F17210 Nicotine dependence, cigarettes, uncomplicated: Secondary | ICD-10-CM | POA: Diagnosis not present

## 2020-03-02 DIAGNOSIS — Z043 Encounter for examination and observation following other accident: Secondary | ICD-10-CM | POA: Diagnosis not present

## 2020-03-02 DIAGNOSIS — Z881 Allergy status to other antibiotic agents status: Secondary | ICD-10-CM | POA: Diagnosis not present

## 2020-03-02 DIAGNOSIS — Z635 Disruption of family by separation and divorce: Secondary | ICD-10-CM | POA: Diagnosis not present

## 2020-03-02 DIAGNOSIS — S32512D Fracture of superior rim of left pubis, subsequent encounter for fracture with routine healing: Secondary | ICD-10-CM | POA: Diagnosis not present

## 2020-03-02 DIAGNOSIS — R269 Unspecified abnormalities of gait and mobility: Secondary | ICD-10-CM | POA: Diagnosis not present

## 2020-03-02 DIAGNOSIS — F32A Depression, unspecified: Secondary | ICD-10-CM | POA: Diagnosis not present

## 2020-03-02 DIAGNOSIS — Z981 Arthrodesis status: Secondary | ICD-10-CM | POA: Diagnosis not present

## 2020-03-02 DIAGNOSIS — S32472A Displaced fracture of medial wall of left acetabulum, initial encounter for closed fracture: Secondary | ICD-10-CM | POA: Diagnosis not present

## 2020-03-02 DIAGNOSIS — Z885 Allergy status to narcotic agent status: Secondary | ICD-10-CM | POA: Diagnosis not present

## 2020-03-02 DIAGNOSIS — S32512A Fracture of superior rim of left pubis, initial encounter for closed fracture: Secondary | ICD-10-CM | POA: Diagnosis not present

## 2020-03-02 DIAGNOSIS — Z9889 Other specified postprocedural states: Secondary | ICD-10-CM | POA: Diagnosis not present

## 2020-03-02 DIAGNOSIS — R7989 Other specified abnormal findings of blood chemistry: Secondary | ICD-10-CM | POA: Diagnosis not present

## 2020-03-02 DIAGNOSIS — N200 Calculus of kidney: Secondary | ICD-10-CM | POA: Diagnosis present

## 2020-03-02 DIAGNOSIS — R1011 Right upper quadrant pain: Secondary | ICD-10-CM | POA: Diagnosis not present

## 2020-03-02 DIAGNOSIS — Z6830 Body mass index (BMI) 30.0-30.9, adult: Secondary | ICD-10-CM | POA: Diagnosis not present

## 2020-03-02 DIAGNOSIS — M6281 Muscle weakness (generalized): Secondary | ICD-10-CM | POA: Diagnosis not present

## 2020-03-02 DIAGNOSIS — M549 Dorsalgia, unspecified: Secondary | ICD-10-CM | POA: Diagnosis not present

## 2020-03-02 DIAGNOSIS — M62838 Other muscle spasm: Secondary | ICD-10-CM | POA: Diagnosis not present

## 2020-03-02 DIAGNOSIS — E669 Obesity, unspecified: Secondary | ICD-10-CM | POA: Diagnosis present

## 2020-03-02 DIAGNOSIS — S3289XA Fracture of other parts of pelvis, initial encounter for closed fracture: Secondary | ICD-10-CM | POA: Diagnosis not present

## 2020-03-02 DIAGNOSIS — R52 Pain, unspecified: Secondary | ICD-10-CM | POA: Diagnosis not present

## 2020-03-02 DIAGNOSIS — Z87898 Personal history of other specified conditions: Secondary | ICD-10-CM | POA: Diagnosis not present

## 2020-03-02 DIAGNOSIS — M545 Low back pain, unspecified: Secondary | ICD-10-CM | POA: Diagnosis not present

## 2020-03-02 DIAGNOSIS — I890 Lymphedema, not elsewhere classified: Secondary | ICD-10-CM | POA: Diagnosis not present

## 2020-03-03 DIAGNOSIS — E669 Obesity, unspecified: Secondary | ICD-10-CM | POA: Diagnosis present

## 2020-03-03 DIAGNOSIS — Z7401 Bed confinement status: Secondary | ICD-10-CM | POA: Diagnosis not present

## 2020-03-03 DIAGNOSIS — M6281 Muscle weakness (generalized): Secondary | ICD-10-CM | POA: Diagnosis not present

## 2020-03-03 DIAGNOSIS — F17200 Nicotine dependence, unspecified, uncomplicated: Secondary | ICD-10-CM | POA: Diagnosis not present

## 2020-03-03 DIAGNOSIS — R7989 Other specified abnormal findings of blood chemistry: Secondary | ICD-10-CM | POA: Diagnosis not present

## 2020-03-03 DIAGNOSIS — Z885 Allergy status to narcotic agent status: Secondary | ICD-10-CM | POA: Diagnosis not present

## 2020-03-03 DIAGNOSIS — R269 Unspecified abnormalities of gait and mobility: Secondary | ICD-10-CM | POA: Diagnosis not present

## 2020-03-03 DIAGNOSIS — R1011 Right upper quadrant pain: Secondary | ICD-10-CM | POA: Diagnosis not present

## 2020-03-03 DIAGNOSIS — M25552 Pain in left hip: Secondary | ICD-10-CM | POA: Diagnosis not present

## 2020-03-03 DIAGNOSIS — R262 Difficulty in walking, not elsewhere classified: Secondary | ICD-10-CM | POA: Diagnosis not present

## 2020-03-03 DIAGNOSIS — S32512D Fracture of superior rim of left pubis, subsequent encounter for fracture with routine healing: Secondary | ICD-10-CM | POA: Diagnosis not present

## 2020-03-03 DIAGNOSIS — S32592D Other specified fracture of left pubis, subsequent encounter for fracture with routine healing: Secondary | ICD-10-CM | POA: Diagnosis not present

## 2020-03-03 DIAGNOSIS — S3289XA Fracture of other parts of pelvis, initial encounter for closed fracture: Secondary | ICD-10-CM | POA: Diagnosis present

## 2020-03-03 DIAGNOSIS — Z9889 Other specified postprocedural states: Secondary | ICD-10-CM | POA: Diagnosis not present

## 2020-03-03 DIAGNOSIS — Z6830 Body mass index (BMI) 30.0-30.9, adult: Secondary | ICD-10-CM | POA: Diagnosis not present

## 2020-03-03 DIAGNOSIS — Z635 Disruption of family by separation and divorce: Secondary | ICD-10-CM | POA: Diagnosis not present

## 2020-03-03 DIAGNOSIS — N200 Calculus of kidney: Secondary | ICD-10-CM | POA: Diagnosis present

## 2020-03-03 DIAGNOSIS — Z9181 History of falling: Secondary | ICD-10-CM | POA: Diagnosis not present

## 2020-03-03 DIAGNOSIS — Z881 Allergy status to other antibiotic agents status: Secondary | ICD-10-CM | POA: Diagnosis not present

## 2020-03-03 DIAGNOSIS — E8809 Other disorders of plasma-protein metabolism, not elsewhere classified: Secondary | ICD-10-CM | POA: Diagnosis present

## 2020-03-03 DIAGNOSIS — M62838 Other muscle spasm: Secondary | ICD-10-CM | POA: Diagnosis not present

## 2020-03-03 DIAGNOSIS — F17210 Nicotine dependence, cigarettes, uncomplicated: Secondary | ICD-10-CM | POA: Diagnosis present

## 2020-03-03 DIAGNOSIS — I890 Lymphedema, not elsewhere classified: Secondary | ICD-10-CM | POA: Diagnosis not present

## 2020-03-03 DIAGNOSIS — Z79899 Other long term (current) drug therapy: Secondary | ICD-10-CM | POA: Diagnosis not present

## 2020-03-03 DIAGNOSIS — S329XXA Fracture of unspecified parts of lumbosacral spine and pelvis, initial encounter for closed fracture: Secondary | ICD-10-CM | POA: Diagnosis not present

## 2020-03-03 DIAGNOSIS — F32A Depression, unspecified: Secondary | ICD-10-CM | POA: Diagnosis present

## 2020-03-03 DIAGNOSIS — Z981 Arthrodesis status: Secondary | ICD-10-CM | POA: Diagnosis not present

## 2020-03-03 DIAGNOSIS — Z87898 Personal history of other specified conditions: Secondary | ICD-10-CM | POA: Diagnosis not present

## 2020-03-08 DIAGNOSIS — S329XXA Fracture of unspecified parts of lumbosacral spine and pelvis, initial encounter for closed fracture: Secondary | ICD-10-CM | POA: Diagnosis not present

## 2020-03-08 DIAGNOSIS — E669 Obesity, unspecified: Secondary | ICD-10-CM | POA: Diagnosis not present

## 2020-03-08 DIAGNOSIS — Z87898 Personal history of other specified conditions: Secondary | ICD-10-CM | POA: Diagnosis not present

## 2020-03-08 DIAGNOSIS — S32592D Other specified fracture of left pubis, subsequent encounter for fracture with routine healing: Secondary | ICD-10-CM | POA: Diagnosis not present

## 2020-03-08 DIAGNOSIS — F32A Depression, unspecified: Secondary | ICD-10-CM | POA: Diagnosis not present

## 2020-03-08 DIAGNOSIS — M62838 Other muscle spasm: Secondary | ICD-10-CM | POA: Diagnosis not present

## 2020-03-08 DIAGNOSIS — R7989 Other specified abnormal findings of blood chemistry: Secondary | ICD-10-CM | POA: Diagnosis not present

## 2020-03-08 DIAGNOSIS — S32512D Fracture of superior rim of left pubis, subsequent encounter for fracture with routine healing: Secondary | ICD-10-CM | POA: Diagnosis not present

## 2020-03-08 DIAGNOSIS — F17200 Nicotine dependence, unspecified, uncomplicated: Secondary | ICD-10-CM | POA: Diagnosis not present

## 2020-03-08 DIAGNOSIS — I890 Lymphedema, not elsewhere classified: Secondary | ICD-10-CM | POA: Diagnosis not present

## 2020-03-08 DIAGNOSIS — M6281 Muscle weakness (generalized): Secondary | ICD-10-CM | POA: Diagnosis not present

## 2020-03-08 DIAGNOSIS — I739 Peripheral vascular disease, unspecified: Secondary | ICD-10-CM | POA: Diagnosis not present

## 2020-03-08 DIAGNOSIS — Z9181 History of falling: Secondary | ICD-10-CM | POA: Diagnosis not present

## 2020-03-08 DIAGNOSIS — E8809 Other disorders of plasma-protein metabolism, not elsewhere classified: Secondary | ICD-10-CM | POA: Diagnosis not present

## 2020-03-08 DIAGNOSIS — R269 Unspecified abnormalities of gait and mobility: Secondary | ICD-10-CM | POA: Diagnosis not present

## 2020-03-08 DIAGNOSIS — Z9889 Other specified postprocedural states: Secondary | ICD-10-CM | POA: Diagnosis not present

## 2020-03-08 DIAGNOSIS — R262 Difficulty in walking, not elsewhere classified: Secondary | ICD-10-CM | POA: Diagnosis not present

## 2020-04-11 ENCOUNTER — Inpatient Hospital Stay: Payer: Medicare Other | Admitting: Sports Medicine
